# Patient Record
Sex: Female | Born: 1975 | Race: White | Hispanic: No | Marital: Married | Smoking: Never smoker
Health system: Southern US, Community
[De-identification: ages and names within clinical notes are randomized; demographics above are authoritative.]

## PROBLEM LIST (undated history)

## (undated) DIAGNOSIS — M79604 Pain in right leg: Secondary | ICD-10-CM

## (undated) DIAGNOSIS — Z8679 Personal history of other diseases of the circulatory system: Secondary | ICD-10-CM

## (undated) DIAGNOSIS — M542 Cervicalgia: Secondary | ICD-10-CM

## (undated) DIAGNOSIS — M549 Dorsalgia, unspecified: Secondary | ICD-10-CM

## (undated) DIAGNOSIS — I10 Essential (primary) hypertension: Secondary | ICD-10-CM

## (undated) DIAGNOSIS — Z9889 Other specified postprocedural states: Secondary | ICD-10-CM

## (undated) DIAGNOSIS — M961 Postlaminectomy syndrome, not elsewhere classified: Secondary | ICD-10-CM

## (undated) DIAGNOSIS — F4323 Adjustment disorder with mixed anxiety and depressed mood: Secondary | ICD-10-CM

## (undated) DIAGNOSIS — R200 Anesthesia of skin: Secondary | ICD-10-CM

## (undated) DIAGNOSIS — Z90710 Acquired absence of both cervix and uterus: Secondary | ICD-10-CM

## (undated) DIAGNOSIS — D649 Anemia, unspecified: Secondary | ICD-10-CM

## (undated) DIAGNOSIS — R112 Nausea with vomiting, unspecified: Secondary | ICD-10-CM

## (undated) HISTORY — DX: Cervicalgia: M54.2

## (undated) HISTORY — DX: Essential (primary) hypertension: I10

## (undated) HISTORY — DX: Anesthesia of skin: R20.0

## (undated) HISTORY — DX: Postlaminectomy syndrome, not elsewhere classified: M96.1

## (undated) HISTORY — DX: Dorsalgia, unspecified: M54.9

## (undated) HISTORY — DX: Adjustment disorder with mixed anxiety and depressed mood: F43.23

## (undated) HISTORY — DX: Pain in right leg: M79.604

## (undated) HISTORY — DX: Acquired absence of both cervix and uterus: Z90.710

---

## 1998-02-23 ENCOUNTER — Ambulatory Visit (HOSPITAL_COMMUNITY): Admission: RE | Admit: 1998-02-23 | Discharge: 1998-02-23 | Payer: Self-pay | Admitting: Obstetrics & Gynecology

## 1998-03-09 ENCOUNTER — Inpatient Hospital Stay (HOSPITAL_COMMUNITY): Admission: AD | Admit: 1998-03-09 | Discharge: 1998-03-09 | Payer: Self-pay | Admitting: Obstetrics & Gynecology

## 1998-04-14 HISTORY — PX: TUBAL LIGATION: SHX77

## 1998-04-21 ENCOUNTER — Emergency Department (HOSPITAL_COMMUNITY): Admission: EM | Admit: 1998-04-21 | Discharge: 1998-04-21 | Payer: Self-pay | Admitting: Emergency Medicine

## 1998-05-08 ENCOUNTER — Inpatient Hospital Stay (HOSPITAL_COMMUNITY): Admission: AD | Admit: 1998-05-08 | Discharge: 1998-05-11 | Payer: Self-pay | Admitting: Obstetrics & Gynecology

## 1998-06-14 ENCOUNTER — Other Ambulatory Visit: Admission: RE | Admit: 1998-06-14 | Discharge: 1998-06-14 | Payer: Self-pay | Admitting: Obstetrics & Gynecology

## 1999-05-20 ENCOUNTER — Other Ambulatory Visit: Admission: RE | Admit: 1999-05-20 | Discharge: 1999-05-20 | Payer: Self-pay | Admitting: Obstetrics & Gynecology

## 2000-11-01 DIAGNOSIS — Z90710 Acquired absence of both cervix and uterus: Secondary | ICD-10-CM

## 2000-11-01 HISTORY — DX: Acquired absence of both cervix and uterus: Z90.710

## 2001-08-19 ENCOUNTER — Other Ambulatory Visit: Admission: RE | Admit: 2001-08-19 | Discharge: 2001-08-19 | Payer: Self-pay | Admitting: Family Medicine

## 2001-10-23 ENCOUNTER — Emergency Department (HOSPITAL_COMMUNITY): Admission: EM | Admit: 2001-10-23 | Discharge: 2001-10-24 | Payer: Self-pay | Admitting: Emergency Medicine

## 2002-11-08 ENCOUNTER — Other Ambulatory Visit: Admission: RE | Admit: 2002-11-08 | Discharge: 2002-11-08 | Payer: Self-pay | Admitting: Family Medicine

## 2003-12-22 ENCOUNTER — Other Ambulatory Visit: Admission: RE | Admit: 2003-12-22 | Discharge: 2003-12-22 | Payer: Self-pay | Admitting: Family Medicine

## 2004-06-27 ENCOUNTER — Emergency Department: Payer: Self-pay | Admitting: General Practice

## 2005-01-31 ENCOUNTER — Ambulatory Visit: Payer: Self-pay | Admitting: Family Medicine

## 2005-02-14 ENCOUNTER — Encounter: Payer: Self-pay | Admitting: Family Medicine

## 2005-02-14 ENCOUNTER — Ambulatory Visit: Payer: Self-pay | Admitting: Family Medicine

## 2005-02-14 ENCOUNTER — Other Ambulatory Visit: Admission: RE | Admit: 2005-02-14 | Discharge: 2005-02-14 | Payer: Self-pay | Admitting: Family Medicine

## 2005-02-28 ENCOUNTER — Ambulatory Visit: Payer: Self-pay | Admitting: Family Medicine

## 2005-04-14 HISTORY — PX: TOTAL ABDOMINAL HYSTERECTOMY: SHX209

## 2005-04-14 HISTORY — PX: ABDOMINAL HYSTERECTOMY: SHX81

## 2006-05-29 ENCOUNTER — Ambulatory Visit: Payer: Self-pay | Admitting: Family Medicine

## 2006-05-29 LAB — CONVERTED CEMR LAB
ALT: 15 units/L (ref 0–40)
AST: 19 units/L (ref 0–37)
Albumin: 4.3 g/dL (ref 3.5–5.2)
Alkaline Phosphatase: 55 units/L (ref 39–117)
BUN: 11 mg/dL (ref 6–23)
Basophils Absolute: 0 10*3/uL (ref 0.0–0.1)
Basophils Relative: 0 % (ref 0.0–1.0)
Bilirubin, Direct: 0.2 mg/dL (ref 0.0–0.3)
CO2: 26 meq/L (ref 19–32)
Calcium: 9.2 mg/dL (ref 8.4–10.5)
Chloride: 106 meq/L (ref 96–112)
Cholesterol: 206 mg/dL (ref 0–200)
Creatinine, Ser: 0.9 mg/dL (ref 0.4–1.2)
Direct LDL: 124.8 mg/dL
Eosinophils Absolute: 0.1 10*3/uL (ref 0.0–0.6)
Eosinophils Relative: 1.2 % (ref 0.0–5.0)
GFR calc Af Amer: 93 mL/min
GFR calc non Af Amer: 77 mL/min
Glucose, Bld: 69 mg/dL — ABNORMAL LOW (ref 70–99)
HCT: 40.2 % (ref 36.0–46.0)
HDL: 65.7 mg/dL (ref >39.0)
Hemoglobin: 14.1 g/dL (ref 12.0–15.0)
Lymphocytes Relative: 24.8 % (ref 12.0–46.0)
MCHC: 35 g/dL (ref 30.0–36.0)
MCV: 87.4 fL (ref 78.0–100.0)
Monocytes Absolute: 0.5 10*3/uL (ref 0.2–0.7)
Monocytes Relative: 6.9 % (ref 3.0–11.0)
Neutro Abs: 5 10*3/uL (ref 1.4–7.7)
Neutrophils Relative %: 67.1 % (ref 43.0–77.0)
Platelets: 214 10*3/uL (ref 150–400)
Potassium: 3.8 meq/L (ref 3.5–5.1)
RBC: 4.6 M/uL (ref 3.87–5.11)
RDW: 11.9 % (ref 11.5–14.6)
Sodium: 140 meq/L (ref 135–145)
TSH: 1.66 microintl units/mL (ref 0.35–5.50)
Total Bilirubin: 1.1 mg/dL (ref 0.3–1.2)
Total CHOL/HDL Ratio: 3.1
Total Protein: 6.8 g/dL (ref 6.0–8.3)
Triglycerides: 59 mg/dL (ref 0–149)
VLDL: 12 mg/dL (ref 0–40)
WBC: 7.5 10*3/uL (ref 4.5–10.5)

## 2006-06-02 ENCOUNTER — Encounter: Payer: Self-pay | Admitting: Family Medicine

## 2006-06-02 ENCOUNTER — Ambulatory Visit: Payer: Self-pay | Admitting: Family Medicine

## 2006-06-02 ENCOUNTER — Other Ambulatory Visit: Admission: RE | Admit: 2006-06-02 | Discharge: 2006-06-02 | Payer: Self-pay | Admitting: Family Medicine

## 2006-06-19 ENCOUNTER — Encounter: Admission: RE | Admit: 2006-06-19 | Discharge: 2006-06-19 | Payer: Self-pay | Admitting: Family Medicine

## 2006-08-27 ENCOUNTER — Ambulatory Visit: Payer: Self-pay | Admitting: Family Medicine

## 2007-06-22 ENCOUNTER — Telehealth: Payer: Self-pay | Admitting: Family Medicine

## 2007-08-12 ENCOUNTER — Ambulatory Visit: Payer: Self-pay | Admitting: Family Medicine

## 2007-08-12 LAB — CONVERTED CEMR LAB
ALT: 14 units/L (ref 0–35)
AST: 18 units/L (ref 0–37)
Albumin: 4.1 g/dL (ref 3.5–5.2)
Alkaline Phosphatase: 51 units/L (ref 39–117)
BUN: 7 mg/dL (ref 6–23)
Basophils Absolute: 0 10*3/uL (ref 0.0–0.1)
Basophils Relative: 0.4 % (ref 0.0–1.0)
Bilirubin, Direct: 0.1 mg/dL (ref 0.0–0.3)
CO2: 30 meq/L (ref 19–32)
Calcium: 9.1 mg/dL (ref 8.4–10.5)
Chloride: 105 meq/L (ref 96–112)
Cholesterol: 209 mg/dL (ref 0–200)
Creatinine, Ser: 0.9 mg/dL (ref 0.4–1.2)
Direct LDL: 150.2 mg/dL
Eosinophils Absolute: 0.1 10*3/uL (ref 0.0–0.7)
Eosinophils Relative: 1.5 % (ref 0.0–5.0)
GFR calc Af Amer: 92 mL/min
GFR calc non Af Amer: 76 mL/min
Glucose, Bld: 78 mg/dL (ref 70–99)
Glucose, Urine, Semiquant: NEGATIVE
HCT: 39.6 % (ref 36.0–46.0)
HDL: 56.7 mg/dL (ref >39.0)
Hemoglobin: 13.6 g/dL (ref 12.0–15.0)
Lymphocytes Relative: 29.2 % (ref 12.0–46.0)
MCHC: 34.4 g/dL (ref 30.0–36.0)
MCV: 87.8 fL (ref 78.0–100.0)
Monocytes Absolute: 0.4 10*3/uL (ref 0.1–1.0)
Monocytes Relative: 7.7 % (ref 3.0–12.0)
Neutro Abs: 3.6 10*3/uL (ref 1.4–7.7)
Neutrophils Relative %: 61.2 % (ref 43.0–77.0)
Nitrite: NEGATIVE
Platelets: 258 10*3/uL (ref 150–400)
Potassium: 4 meq/L (ref 3.5–5.1)
RBC: 4.51 M/uL (ref 3.87–5.11)
RDW: 12.2 % (ref 11.5–14.6)
Sodium: 139 meq/L (ref 135–145)
Specific Gravity, Urine: >=1.03
TSH: 2.24 microintl units/mL (ref 0.35–5.50)
Total Bilirubin: 0.8 mg/dL (ref 0.3–1.2)
Total CHOL/HDL Ratio: 3.7
Total Protein: 6.7 g/dL (ref 6.0–8.3)
Triglycerides: 57 mg/dL (ref 0–149)
Urobilinogen, UA: 0.2
VLDL: 11 mg/dL (ref 0–40)
WBC: 5.8 10*3/uL (ref 4.5–10.5)
pH: 5.5

## 2007-08-18 DIAGNOSIS — F4323 Adjustment disorder with mixed anxiety and depressed mood: Secondary | ICD-10-CM

## 2007-08-18 HISTORY — DX: Adjustment disorder with mixed anxiety and depressed mood: F43.23

## 2007-08-19 ENCOUNTER — Other Ambulatory Visit: Admission: RE | Admit: 2007-08-19 | Discharge: 2007-08-19 | Payer: Self-pay | Admitting: Family Medicine

## 2007-08-19 ENCOUNTER — Ambulatory Visit: Payer: Self-pay | Admitting: Family Medicine

## 2007-08-19 ENCOUNTER — Encounter: Payer: Self-pay | Admitting: Family Medicine

## 2007-08-19 DIAGNOSIS — N6019 Diffuse cystic mastopathy of unspecified breast: Secondary | ICD-10-CM | POA: Insufficient documentation

## 2007-12-29 ENCOUNTER — Ambulatory Visit (HOSPITAL_COMMUNITY): Admission: RE | Admit: 2007-12-29 | Discharge: 2007-12-30 | Payer: Self-pay | Admitting: Obstetrics and Gynecology

## 2007-12-29 ENCOUNTER — Encounter (INDEPENDENT_AMBULATORY_CARE_PROVIDER_SITE_OTHER): Payer: Self-pay | Admitting: Obstetrics and Gynecology

## 2008-01-06 ENCOUNTER — Emergency Department (HOSPITAL_COMMUNITY): Admission: EM | Admit: 2008-01-06 | Discharge: 2008-01-07 | Payer: Self-pay | Admitting: Family Medicine

## 2010-05-16 NOTE — Progress Notes (Signed)
Summary: rx refill  Phone Note Outgoing Call Call back at [phone]   Caller: patient live Call For: Jakye Mullens Summary of Call: patient needs rx refill sertraline hcl 50mg  1 tab at bed time.  patient has cpx on 5-7.  [phone]  please call when called into phyamarcy pt is out ofmeds Initial call taken by: Celine Ahr,  June 22, 2007 4:28 PM Call placed by: jat Call placed to: Patient Summary of Call: Zoloft 50 mg, dispense 100 tablets, directions one nightly for refills [facility] Initial call taken by: Roderick Pee MD,  June 22, 2007 5:18 PM

## 2010-05-16 NOTE — Assessment & Plan Note (Signed)
Summary: cpx/pap/jls   Vital Signs:  Patient Profile:   35 Years Old Female Height:     61 inches Weight:      119 pounds Temp:     98.9 degrees F oral Pulse rate:   58 / minute BP sitting:   100 / 60  (left arm)  Vitals Entered By: Doristine Devoid (Aug 19, 2007 10:38 AM)                 Chief Complaint:  cpx and pap.  History of Present Illness: Brianna Ball is a 35 year old, married female, G2, P2, whose had a BTL, who comes in today for physical examination.  She states her periods are lasting 10 days to two weeks to very heavy and she sliding.  She's taken birth control pills prior to her BTL with no side effects.  She also takes 50 mg of Zoloft daily.  This is helped with her moods.  Lastly, we found two cysts in the right breast.  Mammogram and ultrasound were normal.  The cysts range up to one white except she can still feel one in her right breast around the tender clock position about 2 inches from her nipple.    Current Allergies: ! 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 No apparent alopecia or balding. Eyes:     No corneal or conjunctival inflammation noted. EOMI. Perrla. Funduscopic exam benign, without hemorrhages,  exudates or papilledema. Vision grossly normal. Ears:     External ear exam shows no significant lesions or deformities.  Otoscopic examination reveals clear canals, tympanic membranes are intact bilaterally without bulging, retraction, inflammation or discharge. Hearing is grossly normal bilaterally. Nose:     External nasal examination shows no deformity or inflammation. Nasal mucosa are pink and moist without lesions or exudates. Mouth:     Oral mucosa and oropharynx without lesions or exudates.  Teeth in good repair. Neck:     No deformities, masses, or tenderness noted. Chest Wall:     No deformities, masses, or tenderness noted. Breasts:     left wrist normal.  The right breast is normal except for a soft movable cystic-like lesion at the 10 o'clock position on the right breast about 2 inches from the nipple.  This is the site where she had a previous cyst.  It was much bigger and now it shrunk down in size,  but still present. Lungs:     Normal respiratory effort, chest expands symmetrically. Lungs are clear to auscultation, no crackles or wheezes. Heart:     Normal rate and regular rhythm. S1 and S2 normal without gallop, murmur, click, rub or other extra sounds. Msk:     No deformity or scoliosis noted of thoracic or lumbar spine.   Pulses:     R and L carotid,radial,femoral,dorsalis pedis and posterior tibial pulses are full and equal bilaterally Extremities:     No clubbing, cyanosis, edema, or deformity noted with normal full range of motion of all joints.   Neurologic:     No cranial nerve deficits noted. Station and gait are normal. Plantar reflexes are down-going bilaterally. DTRs are symmetrical throughout. Sensory, motor and coordinative functions appear intact. Skin:     Intact without suspicious lesions or rashes Cervical Nodes:     No lymphadenopathy noted Axillary Nodes:     No palpable lymphadenopathy Inguinal Nodes:     No significant adenopathy Psych:      Cognition and judgment appear intact. Alert and cooperative with normal attention span and concentration. No apparent delusions, illusions, hallucinations    Impression & Recommendations:  Problem # 1:  DYSFUNCTIONAL UTERINE BLEEDING (ICD-626.8) Assessment: Deteriorated  Problem # 2:  FIBROCYSTIC BREAST DISEASE (ICD-610.1) Assessment: Improved  Problem # 3:  EMOTIONAL INSTABILITY (ICD-296.99) Assessment: Improved  Complete Medication List: 1)  Multivitamins Tabs (Multiple vitamin) .... Once daily 2)  Zoloft 100 Mg Tabs (Sertraline hcl) .Marland Kitchen.. 1 tab @ bedtime 3)  Seasonale 0.15-0.03 Mg Tabs (Levonorgest-eth estrad 91-day) .... Uad   Patient Instructions: 1)  Take an Aspirin every day 2)  begin the Seasonale, tonight.  Return April 2003.  Next checkup sooner if any problems.  Remember to take an aspirin tablet daily with the BCPs also do a thorough breast exam once a month.  On your birthday.  Return if any problems   Prescriptions: SEASONALE 0.15-0.03 MG  TABS (LEVONORGEST-ETH ESTRAD 91-DAY) UAD  #1 x 3   Entered and Authorized by:   Roderick Pee MD   Signed by:   Roderick Pee MD on 08/19/2007   Method used:   Electronically sent to ...       [facility] [phone]       [address]       [place], [place]  [address]       Ph: [phone] or [phone]       Fax: [phone]   RxID:   [id number] ZOLOFT 100 MG  TABS (SERTRALINE HCL) 1 tab @ bedtime  #100 x 4   Entered and Authorized by:   Roderick Pee MD   Signed by:   Roderick Pee MD on 08/19/2007   Method used:   Electronically sent to ...       [facility] [place] [phone]       [address]       [place], [place]  [address]       Ph: [phone] or [phone]       Fax: [phone]   RxID:   [id number]  ]

## 2010-08-27 NOTE — Op Note (Signed)
Brianna Ball, Brianna Ball                ACCOUNT NO.:  [id number]   MEDICAL RECORD NO.:  [id number]          PATIENT TYPE:  OIB   LOCATION:  9303                          [facility]   PHYSICIAN:  Lenoard Aden, M.D.DATE OF BIRTH:  18-May-1975   DATE OF PROCEDURE:  12/29/2007  DATE OF DISCHARGE:                               OPERATIVE REPORT   PREOPERATIVE DIAGNOSES:  Dysmenorrhea and menorrhagia.   POSTOPERATIVE DIAGNOSES:  Dysmenorrhea and menorrhagia, enterocele.   PROCEDURES:  Total laparoscopic hysterectomy, McCall culdoplasty, and  lysis of adhesions.   SURGEON:  Lenoard Aden, MD   ASSISTANT:  Genia Del, M.D.   ANESTHESIA:  General.   ESTIMATED BLOOD LOSS:  Less than 100 mL.   COMPLICATIONS:  None.   DRAINS:  Foley.   COUNTS:  Correct.   The patient to recovery in good condition.   SPECIMEN:  Uterus and cervix to pathology.   BRIEF OPERATIVE NOTE:  After being apprised of risks of anesthesia,  infection, bleeding, injury to abdominal organs and need for repair,  delayed versus immediate complications to include bowel and bladder  injury, the patient was brought to the operating room where she was  administered general anesthetic without complications, prepped and  draped in usual sterile fashion.  Foley catheter was placed.  At this  time, after feet were placed in the Yellofin stirrups, a RUMI retractor  was placed per vagina in the standard fashion without difficulty.  At  this time, the infraumbilical incision was made with a scalpel.  Veress  needle was placed.  Opening pressure of -1 noted.  Three and a half  liter of CO2 insufflated without difficulty.  Trocar placed  atraumatically.  Pictures taken.  Normal liver, gallbladder bed, normal  appendiceal area, previously divided interrupted tubes and bilateral  normal ovaries, normal sized uterus, and normal posterior cul-de-sac and  anterior cul-de-sac with multiple adhesions of the bladder flap  to the  left round ligament and left peritoneal sidewall.  At this time, two 5-  mm trocars were made bilaterally under direct visualization and  transillumination in the right and left lower quadrant, and the bladder  adhesions to the round ligament into the left peritoneal sidewall were  lysed sharply using EndoShears.  At this time, the bladder flap was then  sharply developed carefully using sharp dissection along the lower  uterine segment.  After exposing the bladder reflection without  difficulty, the round ligaments were bilaterally grasped and ligated  using the Gyrus.  The tubo-ovarian ligaments were bilaterally grasped  and ligated using the Gyrus.  The uterine vessels were skeletonized  bilaterally, cauterized and divided.  The posterior reflection was  developed as well using sharp dissection.  The bladder flap was further  developed exposing the cervicovaginal junction and the RUMI cup was  palpated through the tissue.  At this time, a spatula was placed and the  circumcision was done circumferentially around the RUMI cup from along  the posterior back wall and then anteriorly.  Specimen is detached and  retracted into the vagina.  Good hemostasis was achieved  along the cuff  using the Gyrus, and at this time the Quill suture was used and entered  to suture in a continuous fashion from the right angle to the midline  and is cut and secured to midline and then from the left angle to the  midline without difficulty.  Enterocele was identified and plicated from  side to side the uterosacrals to uterosacral after previously  identifying the ureters bilaterally and also secured.  At this time,  good hemostasis was noted.  Irrigation was accomplished.  CO2 was  released and on revisualization, there was no evidence of any bleeding.  All instruments were removed under direct visualization after CO2 was  then released.  Good hemostasis was noted.  The incisions were closed  using 0  Vicryl  and Dermabond.  The patient tolerated the procedure  well.  Specimen was then removed from the vagina.  The patient was  awakened and transferred to recovery in good condition.      Lenoard Aden, M.D.  Electronically Signed     RJT/MEDQ  D:  12/29/2007  T:  12/30/2007  Job:  [id number]

## 2010-08-27 NOTE — Discharge Summary (Signed)
Brianna Ball, Brianna Ball                ACCOUNT NO.:  [id number]   MEDICAL RECORD NO.:  [id number]          PATIENT TYPE:  OIB   LOCATION:  9303                          [facility]   PHYSICIAN:  Lenoard Aden, M.D.DATE OF BIRTH:  1975-05-12   DATE OF ADMISSION:  12/29/2007  DATE OF DISCHARGE:                               DISCHARGE SUMMARY   CHIEF COMPLAINT:  Dysmenorrhea and menorrhagia.  She is a white female,  G3, P2, history C-section, tubal ligation with worsening dysmenorrhea,  menorrhagia, normal lab workup, abnormal ultrasound for definitive  therapy.   She has no known drug allergies.   Medications are Celexa.   FAMILY HISTORY:  Noncontributory.   SOCIAL HISTORY:  Noncontributory.   SURGICAL HISTORY:  Remarkable for C-section x2 and tubal ligation.   PHYSICAL EXAMINATION:  GENERAL:  Well-developed, well-nourished white  female in no acute distress.  HEENT:  Normal.  LUNGS:  Clear.  HEART:  Regular rate and rhythm.  ABDOMEN:  Soft, nontender.  PELVIC:  Reveals a normal sized uterus, mobile, no adnexal masses.  EXTREMITIES:  There are no cords.  NEUROLOGIC:  Nonfocal.  SKIN:  Intact.   IMPRESSION:  Severe refractory dysmenorrhea and menorrhagia, history of  C-section and tubal ligation for definitive therapy.   PLAN:  Proceed with proposed TLH, possible LAVH, and possible TAH.  Risks of anesthesia, infection, bleeding, injury to abdominal organs,  need for repair were discussed, delayed versus immediate complications  to include bowel and bladder injury noted.  The patient acknowledges and  wishes to proceed.      Lenoard Aden, M.D.  Electronically Signed     RJT/MEDQ  D:  12/29/2007  T:  12/29/2007  Job:  [id number]

## 2010-08-27 NOTE — H&P (Signed)
NAMEKENLEIGH, Brianna Ball                ACCOUNT NO.:  [id number]   MEDICAL RECORD NO.:  [id number]          PATIENT TYPE:  OIB   LOCATION:  9303                          [facility]   PHYSICIAN:  Lenoard Aden, M.D.DATE OF BIRTH:  Oct 02, 1975   DATE OF ADMISSION:  12/29/2007  DATE OF DISCHARGE:                              HISTORY & PHYSICAL   CHIEF COMPLAINT:  Dysmenorrhea, dyspareunia, and menorrhagia.   HISTORY:  A 35 year old white female, G3, P2, history of C-section x2  for definitive therapy.   ALLERGIES:  She has allergies to CODEINE DERIVATIVES.   She is a nonsmoker and nondrinker.  Denies domestic physical violence.   FAMILY HISTORY:  Diabetes and hypertension.   MEDICATIONS:  Celexa.   PERSONAL HISTORY:  Depression.  She has a history of 2 uncomplicated C-  sections and a tubal ligation.   PHYSICAL EXAMINATION:  VITAL SIGNS:  She is 5 feet and 1 inches.  Weight  of 125 pounds.  HEENT:  Normal.  LUNGS:  Clear.  HEART:  Regular rate and rhythm.  ABDOMEN:  Soft and nontender.  PELVIC:  Normal-sized uterus.  No adnexal masses.  EXTREMITIES:  There are no cords.  NEUROLOGIC:  Nonfocal.  SKIN:  Intact.   IMPRESSION:  Refractory dysmenorrhea, menorrhagia, and dysmenorrhea with  normal ultrasound and normal labs.  The patient desires definitive  therapy.   PLAN:  Proceed with TLH versus LAVH versus TAH.  Risks of anesthesia,  infection, bleeding, injury to abdominal organs, and need for repair  were discussed.  Delayed versus immediate complications to include bowel  and bladder injury noted, inability to cure pelvic pain discussed.  The  patient acknowledges and will proceed.      Lenoard Aden, M.D.  Electronically Signed     RJT/MEDQ  D:  12/28/2007  T:  12/29/2007  Job:  [id number]

## 2011-01-13 LAB — CBC
HCT: 31.3 — ABNORMAL LOW
HCT: 37.1
HCT: 40
Hemoglobin: 10.6 — ABNORMAL LOW
Hemoglobin: 12.7
Hemoglobin: 13.6
MCHC: 34
MCHC: 34.1
MCHC: 34.3
MCV: 87.3
MCV: 88.1
MCV: 89.4
Platelets: 231
Platelets: 260
Platelets: 274
RBC: 3.5 — ABNORMAL LOW
RBC: 4.25
RBC: 4.54
RDW: 12.1
RDW: 12.5
RDW: 12.7
WBC: 11.4 — ABNORMAL HIGH
WBC: 14.3 — ABNORMAL HIGH
WBC: 7.2

## 2011-01-13 LAB — DIFFERENTIAL
Basophils Absolute: 0.1
Basophils Relative: 1
Eosinophils Absolute: 0.1
Eosinophils Relative: 1
Lymphocytes Relative: 16
Lymphs Abs: 2.3
Monocytes Absolute: 1.1 — ABNORMAL HIGH
Monocytes Relative: 8
Neutro Abs: 10.6 — ABNORMAL HIGH
Neutrophils Relative %: 74

## 2011-01-13 LAB — URINALYSIS, ROUTINE W REFLEX MICROSCOPIC
Bilirubin Urine: NEGATIVE
Glucose, UA: NEGATIVE
Ketones, ur: 40 — AB
Leukocytes, UA: NEGATIVE
Nitrite: NEGATIVE
Protein, ur: NEGATIVE
Specific Gravity, Urine: 1.012
Urobilinogen, UA: 0.2
pH: 6

## 2011-01-13 LAB — COMPREHENSIVE METABOLIC PANEL
ALT: 24
AST: 19
Albumin: 3.8
Alkaline Phosphatase: 54
BUN: 8
CO2: 24
Calcium: 9.1
Chloride: 105
Creatinine, Ser: 0.72
GFR calc Af Amer: >60
GFR calc non Af Amer: >60
Glucose, Bld: 92
Potassium: 3.9
Sodium: 136
Total Bilirubin: 0.8
Total Protein: 6.5

## 2011-01-13 LAB — POCT URINALYSIS DIP (DEVICE)
Bilirubin Urine: NEGATIVE
Glucose, UA: NEGATIVE
Ketones, ur: 40 — AB
Nitrite: NEGATIVE
Operator id: 239701
Protein, ur: NEGATIVE
Specific Gravity, Urine: <=1.005
Urobilinogen, UA: 0.2
pH: 5.5

## 2011-01-13 LAB — HCG, SERUM, QUALITATIVE: Preg, Serum: NEGATIVE

## 2011-01-13 LAB — URINE MICROSCOPIC-ADD ON

## 2011-01-13 LAB — POCT PREGNANCY, URINE: Preg Test, Ur: NEGATIVE

## 2014-04-14 HISTORY — PX: CERVICAL DISC SURGERY: SHX588

## 2014-07-08 ENCOUNTER — Emergency Department: Payer: Self-pay | Admitting: Emergency Medicine

## 2014-07-08 LAB — CBC
HCT: 40 % (ref 35.0–47.0)
HGB: 13.3 g/dL (ref 12.0–16.0)
MCH: 28.8 pg (ref 26.0–34.0)
MCHC: 33.2 g/dL (ref 32.0–36.0)
MCV: 87 fL (ref 80–100)
Platelet: 247 10*3/uL (ref 150–440)
RBC: 4.61 10*6/uL (ref 3.80–5.20)
RDW: 12.9 % (ref 11.5–14.5)
WBC: 11 10*3/uL (ref 3.6–11.0)

## 2014-07-08 LAB — COMPREHENSIVE METABOLIC PANEL
Albumin: 4.3 g/dL
Alkaline Phosphatase: 55 U/L
Anion Gap: 5 — ABNORMAL LOW (ref 7–16)
BUN: 7 mg/dL
Bilirubin,Total: 0.7 mg/dL
Calcium, Total: 9 mg/dL
Chloride: 105 mmol/L
Co2: 27 mmol/L
Creatinine: 0.65 mg/dL
EGFR (African American): >60
EGFR (Non-African Amer.): >60
Glucose: 111 mg/dL — ABNORMAL HIGH
Potassium: 3.6 mmol/L
SGOT(AST): 18 U/L
SGPT (ALT): 14 U/L
Sodium: 137 mmol/L
Total Protein: 6.7 g/dL

## 2014-07-08 LAB — TROPONIN I: Troponin-I: <0.03 ng/mL

## 2014-07-11 ENCOUNTER — Other Ambulatory Visit: Payer: Self-pay | Admitting: Orthopedic Surgery

## 2014-07-11 DIAGNOSIS — M25511 Pain in right shoulder: Secondary | ICD-10-CM

## 2014-07-11 DIAGNOSIS — M542 Cervicalgia: Secondary | ICD-10-CM

## 2014-07-12 ENCOUNTER — Ambulatory Visit
Admission: RE | Admit: 2014-07-12 | Discharge: 2014-07-12 | Disposition: A | Discharge status: Home / Self Care | Payer: 59 | Source: Ambulatory Visit | Attending: Orthopedic Surgery | Admitting: Orthopedic Surgery

## 2014-07-12 DIAGNOSIS — M542 Cervicalgia: Secondary | ICD-10-CM

## 2014-07-12 DIAGNOSIS — M25511 Pain in right shoulder: Secondary | ICD-10-CM

## 2014-07-14 ENCOUNTER — Other Ambulatory Visit: Payer: Self-pay

## 2014-07-14 DIAGNOSIS — M542 Cervicalgia: Secondary | ICD-10-CM | POA: Insufficient documentation

## 2015-11-02 ENCOUNTER — Encounter: Payer: Self-pay | Admitting: Family Medicine

## 2015-11-02 ENCOUNTER — Ambulatory Visit (INDEPENDENT_AMBULATORY_CARE_PROVIDER_SITE_OTHER): Payer: 59 | Admitting: Family Medicine

## 2015-11-02 VITALS — BP 131/72 | HR 67 | Resp 16 | Ht 61.0 in | Wt 125.0 lb

## 2015-11-02 DIAGNOSIS — Z23 Encounter for immunization: Secondary | ICD-10-CM | POA: Diagnosis not present

## 2015-11-02 DIAGNOSIS — Z7189 Other specified counseling: Secondary | ICD-10-CM | POA: Insufficient documentation

## 2015-11-02 DIAGNOSIS — F4323 Adjustment disorder with mixed anxiety and depressed mood: Secondary | ICD-10-CM | POA: Diagnosis not present

## 2015-11-02 DIAGNOSIS — M5020 Other cervical disc displacement, unspecified cervical region: Secondary | ICD-10-CM

## 2015-11-02 DIAGNOSIS — M5010 Cervical disc disorder with radiculopathy, unspecified cervical region: Secondary | ICD-10-CM | POA: Insufficient documentation

## 2015-11-02 DIAGNOSIS — N6019 Diffuse cystic mastopathy of unspecified breast: Secondary | ICD-10-CM

## 2015-11-02 DIAGNOSIS — Z90710 Acquired absence of both cervix and uterus: Secondary | ICD-10-CM | POA: Diagnosis not present

## 2015-11-02 DIAGNOSIS — M542 Cervicalgia: Secondary | ICD-10-CM

## 2015-11-02 DIAGNOSIS — M5410 Radiculopathy, site unspecified: Secondary | ICD-10-CM | POA: Insufficient documentation

## 2015-11-02 HISTORY — DX: Cervicalgia: M54.2

## 2015-11-02 NOTE — Progress Notes (Signed)
Brianna Ball, D.O. [facility]   Subjective:    Chief Complaint  Patient presents with  . Establish Care   New pt, here to establish care.   HPI: Brianna DolphinJami R Ball is Ball pleasant 40 y.o. female who [facility] today To become established.  Last PCP- Brianna Ball.   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  Son- 17yo.  Daughter- 7020- Brianna Ball who I will see later on today.    Husband's family owns [employer] Ball Nurse, children'sheating [employer] air company.   Patient has concerns and questions about eating healthier and desires to get in shape.   She has no significant family history except for her mother having hypertension.  She has always had normal Pap smears and goes for yearly mammograms.      She declines any other further history. Of note I found in care everywhere:   Seen in March 2016 by Brianna Ball, of [facility]oped[facility]- for right shoulder and right neck pain. Later [facility]  She was seen in April 2016 by Ball doctor through [facility] [place]. Dr. Lurena NidaKamal Sami Ball, PAIN Medicine  for right upper extremity cervical radiculopathy, chronic neck pain.  At that time she was on Zanaflex, and oxycodone which was not strong enough and she was given Dilaudid for pain.      [place]erv[place] in [place] on 07/12/14 shows C5/C6 disc extrusion resulting in severe Right C6 foraminal stenosis.   Patient was also sent for surgical consultation to Brianna Ball by [facility]       Past Medical History  Diagnosis Date  . FIBROCYSTIC BREAST DISEASE 08/19/2007    Qualifier: Diagnosis of  By: Brianna Ball   . h/o Chronic cervical radiculopathy- 3/16 11/02/2015  . h/o Adjustment disorder with mixed anxiety and depressed mood 08/18/2007   Qualifier: Diagnosis of  By: Brianna Ball    . h/o Cervicalgia 11/02/2015  . History of hysterectomy for benign disease (DUB) 11/01/2000      Past Surgical History  Procedure Laterality Date  . Tubal ligation  2000  . Abdominal hysterectomy  2007  . Cervical disc surgery  2016      Family History  Problem Relation Age of Onset  . Hypertension Mother       History  Drug Use No  ,    History  Alcohol Use No  ,    History  Smoking status  . Never Smoker   Smokeless tobacco  . Never Used  ,     History  Sexual Activity  . Sexual Activity: Yes      Patient's Medications  New Prescriptions   No medications on file  Previous Medications   MULTIPLE VITAMINS-MINERALS (MULTIVITAMIN WITH MINERALS) TABLET    Take 1 tablet by mouth daily.  Modified Medications   No medications on file  Discontinued Medications   No medications on file     Codeine Outpatient Encounter Prescriptions as of 11/02/2015  Medication Sig  . Multiple Vitamins-Minerals (MULTIVITAMIN WITH MINERALS) tablet Take 1 tablet by mouth daily.   No facility-administered encounter medications on file as of 11/02/2015.     Immunization History  Administered Date(s) Administered  . Tdap 11/02/2015  Review of Systems:   ( Completed via Adult Medical History Intake form today ) General:   Denies fever, chills, appetite changes, unexplained weight loss.  Optho/Auditory:   Denies visual changes, blurred vision/LOV, ringing in ears/ diff hearing Respiratory:   Denies SOB, DOE, cough, wheezing.  Cardiovascular:   Denies chest pain, palpitations, new onset peripheral edema  Gastrointestinal:   Denies nausea, vomiting, diarrhea.  Genitourinary:    Denies dysuria, increased frequency, flank pain.  Endocrine:     Denies hot or cold intolerance, polyuria, polydipsia. Musculoskeletal:  Denies unexplained myalgias, joint swelling, arthralgias, gait problems.  Skin:  Denies rash, suspicious  lesions or new/ changes in moles Neurological:    Denies dizziness, syncope, unexplained weakness, lightheadedness, numbness  Psychiatric/Behavioral:   Denies mood changes, suicidal or homicidal ideations, hallucinations    Objective:   Blood pressure 131/72, pulse 67, resp. rate 16, height  (1.549 m), weight 125 lb (56.7 kg), SpO2 99 %. Body mass index is 23.63 kg/(m^2).  General: Well Developed, well nourished, and in no acute distress.  Neuro: Alert and oriented x3, extra-ocular muscles intact, sensation grossly intact.  HEENT: Normocephalic, atraumatic, pupils equal round reactive to light, neck supple, no gross masses, no carotid bruits, no JVD apprec Skin: no gross suspicious lesions or rashes  Cardiac: Regular rate and rhythm, no murmurs rubs or gallops.  Respiratory: Essentially clear to auscultation bilaterally. Not using accessory muscles, speaking in full sentences.  Abdominal: Soft, not grossly distended Musculoskeletal: Ambulates w/o diff, FROM * 4 ext.  Vasc: less 2 sec cap RF, warm and pink  Psych:  No HI/SI, judgement and insight good.    Impression and Recommendations:      1. Counseling on health promotion and disease prevention   2. h/o Adjustment disorder with mixed anxiety and depressed mood   3. History of hysterectomy for benign disease (DUB)   4. Diffuse cystic mastopathy, unspecified laterality   5. Need for Tdap vaccination   6. h/o Cervicalgia   7. h/o Cervical disc prolapse with radiculopathy- s/p decompression - anterior approach   Pt was in the office today for 40+ minutes, with over 50% time spent in face to face counseling of various medical questions and concerns; and in coordination of care.  The patient was counseled on including, but not limited to, normal blood pressure, BMI, CV health and emotional wellness issues;  risk factors for various diseases were discussed, anticipatory guidance given, need for routine screening examinations yearly  discussed.   Will get me records from [facility]- recent labs that where done- pt doesn't want to be redundant.    F/up one mo- come fasting in case we need additional labs.    Her Health Goal:  in am and 15 in pm- fast walking.  And take time for self- at least  Please see AVS handed out to patient at the end of our visit for further patient instructions/ counseling done pertaining to today's office visit.     Orders Placed This Encounter  Procedures  . Tdap vaccine greater than or equal to 7yo IM    Note: This document was prepared using Dragon voice recognition software and may include unintentional dictation errors.

## 2015-11-02 NOTE — Patient Instructions (Addendum)
[facility].    F/up one mo- come fasting in case.    Goal:  15min in am and 15 in pm- fast walking.      Top Ten Foods for Health  1. Water Drink at least 8 to 12 cups of water daily. Consume half of your body weight in pounds, is the amount of water in ounces to drink daily.  Ie: a 200lb person = 100 oz water daily  2. Dark Green Vegetables Eat dark green vegetables at least three to four times a week. Good options include broccoli, peppers, brussel sprouts and leafy greens like kale and spinach.  3. Whole Grains Whole grains should be included in your diet at least two to three times daily. Look for whole wheat flour, rye, oatmeal, barley, amaranth, quinoa or a multigrain. A good source of fiber includes 3 to 4 grams of fiber per serving. A great source has 5 or more grams of fiber per serving.  4. Beans and Lentils Try to eat a bean-based meal at least once a week. Try to add legumes, including beans and lentils, to soups, stews, casseroles, salads and dips or eat them plain.  5. Fish Try to eat two to three serving of fish a week. A serving consists of 3 to 4 ounces of cooked fish. Good choices are salmon, trout, herring, bluefish, sardines and tuna.  6. Berries Include two to four servings of fruit in your diet each day. Try to eat berries such as raspberries, blueberries, blackberries and strawberries.  7. Winter Squash Eat butternut and acorn squash as well as other richly pigmented dark orange and green colored vegetables like sweet potato, cantaloupe and mango.  8. Soy 25 grams of soy protein a day is recommended as part of a low-fat diet to help lower cholesterol levels. Try tofu, soymilk, edamame soybeans, tempeh and texturized vegetable protein (TVP).  9. Flaxseed, Nuts and Seeds Add 1 to 2 tablespoons of ground flaxseed or other seeds to food each day or include a moderate amount of nuts - 1/4 cup - in your daily diet.  10. Organic Yogurt Men  and women between 3319 and 42 years of age need 1000 milligrams of calcium a day and 1200 milligrams if 50 or older. Eat calcium-rich foods such as nonfat or low-fat dairy products three to four times a day. Include organic choices.

## 2015-11-21 ENCOUNTER — Ambulatory Visit: Payer: 59 | Admitting: Family Medicine

## 2015-11-23 ENCOUNTER — Ambulatory Visit (INDEPENDENT_AMBULATORY_CARE_PROVIDER_SITE_OTHER): Payer: 59

## 2015-11-23 ENCOUNTER — Encounter: Payer: Self-pay | Admitting: Family Medicine

## 2015-11-23 DIAGNOSIS — Z0000 Encounter for general adult medical examination without abnormal findings: Secondary | ICD-10-CM

## 2015-11-23 LAB — POCT GLYCOSYLATED HEMOGLOBIN (HGB A1C): Hemoglobin A1C: 5.1

## 2015-11-23 NOTE — Progress Notes (Signed)
Pt here for labs only.  T. Nelson, CMA 

## 2015-11-24 LAB — CBC WITH DIFFERENTIAL/PLATELET
Basophils Absolute: 57 cells/uL (ref 0–200)
Basophils Relative: 1 %
Eosinophils Absolute: 114 cells/uL (ref 15–500)
Eosinophils Relative: 2 %
HCT: 40.7 % (ref 35.0–45.0)
Hemoglobin: 13.5 g/dL (ref 11.7–15.5)
Lymphocytes Relative: 35 %
Lymphs Abs: 1995 cells/uL (ref 850–3900)
MCH: 28.7 pg (ref 27.0–33.0)
MCHC: 33.2 g/dL (ref 32.0–36.0)
MCV: 86.4 fL (ref 80.0–100.0)
MPV: 11.8 fL (ref 7.5–12.5)
Monocytes Absolute: 513 cells/uL (ref 200–950)
Monocytes Relative: 9 %
Neutro Abs: 3021 cells/uL (ref 1500–7800)
Neutrophils Relative %: 53 %
Platelets: 290 10*3/uL (ref 140–400)
RBC: 4.71 MIL/uL (ref 3.80–5.10)
RDW: 13.1 % (ref 11.0–15.0)
WBC: 5.7 10*3/uL (ref 3.8–10.8)

## 2015-11-24 LAB — LIPID PANEL
Cholesterol: 215 mg/dL — ABNORMAL HIGH (ref 125–200)
HDL: 76 mg/dL (ref >=46)
LDL Cholesterol: 121 mg/dL (ref <130)
Total CHOL/HDL Ratio: 2.8 Ratio (ref <=5.0)
Triglycerides: 91 mg/dL (ref <150)
VLDL: 18 mg/dL (ref <30)

## 2015-11-24 LAB — COMPREHENSIVE METABOLIC PANEL
ALT: 16 U/L (ref 6–29)
AST: 16 U/L (ref 10–30)
Albumin: 4.7 g/dL (ref 3.6–5.1)
Alkaline Phosphatase: 55 U/L (ref 33–115)
BUN: 10 mg/dL (ref 7–25)
CO2: 24 mmol/L (ref 20–31)
Calcium: 9.4 mg/dL (ref 8.6–10.2)
Chloride: 104 mmol/L (ref 98–110)
Creat: 0.88 mg/dL (ref 0.50–1.10)
Glucose, Bld: 83 mg/dL (ref 65–99)
Potassium: 4.3 mmol/L (ref 3.5–5.3)
Sodium: 139 mmol/L (ref 135–146)
Total Bilirubin: 0.6 mg/dL (ref 0.2–1.2)
Total Protein: 6.5 g/dL (ref 6.1–8.1)

## 2015-11-24 LAB — VITAMIN D 25 HYDROXY (VIT D DEFICIENCY, FRACTURES): Vit D, 25-Hydroxy: 29 ng/mL — ABNORMAL LOW (ref 30–100)

## 2015-11-24 LAB — TSH: TSH: 2.05 mIU/L

## 2015-11-26 NOTE — Progress Notes (Signed)
  I will discuss the results of these tests with the patient at our upcoming planned follow-up office visit.    (However Tonya, if patient is a no-show, please send them a letter with these results and ask pt to please contact our office for a follow-up office visit so we can discuss them and determine what further action, if any, is needed.  Thank you!)

## 2015-12-04 ENCOUNTER — Ambulatory Visit: Payer: 59 | Admitting: Family Medicine

## 2015-12-05 ENCOUNTER — Ambulatory Visit (INDEPENDENT_AMBULATORY_CARE_PROVIDER_SITE_OTHER): Payer: 59 | Admitting: Family Medicine

## 2015-12-05 ENCOUNTER — Encounter: Payer: Self-pay | Admitting: Family Medicine

## 2015-12-05 VITALS — BP 132/79 | HR 66 | Wt 123.3 lb

## 2015-12-05 DIAGNOSIS — R7989 Other specified abnormal findings of blood chemistry: Secondary | ICD-10-CM | POA: Insufficient documentation

## 2015-12-05 DIAGNOSIS — E559 Vitamin D deficiency, unspecified: Secondary | ICD-10-CM

## 2015-12-05 DIAGNOSIS — Z7189 Other specified counseling: Secondary | ICD-10-CM | POA: Diagnosis not present

## 2015-12-05 DIAGNOSIS — F4323 Adjustment disorder with mixed anxiety and depressed mood: Secondary | ICD-10-CM

## 2015-12-05 NOTE — Progress Notes (Signed)
Assessment and plan:  1. h/o Adjustment disorder with mixed anxiety and depressed mood   2. Vitamin D insufficiency   3. High serum high density lipoprotein (HDL)   4. Counseling on health promotion and disease prevention      Advised patient to use meditation and other techniques of relaxation on a daily basis.  Take 5,000 IU vit D3 daily.  We can rechk in 30moor w/in 1 yr  Cont to move more as we can see this has proven to be very beneficial to your health and labs as your HDL is the highest it's been in 6-8 years.   Melatonin 173mat most nightly-->    8 hour deep sleep: Delta waves, relaxing music sleep, sleep music, sleep meditation by yellow [employer] ( binaural beats or "delta waves" )   Guided meditation for sleep. Floating amongst the stars by JaGwynne EdingerHypnosis for clearing subconscious negativity by MiEdman CircleGuided meditation for detachment from overthinking by [place]atient to return to clinic if sleep is troublesome or she can't sleep/ relax as I told her we could discuss medications.   Patient's Medications  New Prescriptions   No medications on file  Previous Medications   MULTIPLE VITAMINS-MINERALS (MULTIVITAMIN WITH MINERALS) TABLET    Take 1 tablet by mouth daily.  Modified Medications   No medications on file  Discontinued Medications   No medications on file    Return in about 6 months (around 06/06/2016) for Follow-up of current medical issues.  Anticipatory guidance and routine counseling done re: condition, txmnt options and need for follow up. All questions of patient's were answered.   Gross side effects, risk and benefits, and alternatives of medications discussed with patient.  Patient is aware that all medications have potential side effects and we are unable to predict every sideeffect or drug-drug interaction that may occur.  Expresses  verbal understanding and consents to current therapy plan and treatment regiment.  Please see AVS handed out to patient at the end of our visit for additional patient instructions/ counseling done pertaining to today's office visit.  Note: This document was prepared using Dragon voice recognition software and may include unintentional dictation errors.   ----------------------------------------------------------------------------------------------------------------------  Subjective:   CC: Brianna Ball is a [age].o. 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    She is busy taking care of the house and admits to being a little OCD with cleanliness. She vacuums at least once daily.    She complains of difficulty with sleep. She always wakes up around 3 AM and then starts thinking and finds it difficult to fall back asleep.   Does not exercise but states she keeps herself very busy and going all the time.   Eats pretty healthy   Wt Readings from Last 3 Encounters:  12/05/15 123 lb 4.8 oz (55.9 kg)  11/02/15 125 lb (56.7 kg)  07/12/14 127 lb (57.6 kg)   BP Readings from Last 3 Encounters:  12/05/15 132/79  11/02/15 131/72  08/19/07 100/60   Pulse Readings from Last 3 Encounters:  12/05/15 66  11/02/15 67  08/19/07 (!) 58      Full medical history updated and reviewed in the office today  Patient  Active Problem List   Diagnosis Date Noted  . h/o Adjustment disorder with mixed anxiety and depressed mood 08/18/2007    Priority: High  . History of hysterectomy for benign disease (DUB) 11/01/2000    Priority: Medium  . Vitamin D insufficiency 12/05/2015  . High serum high density lipoprotein (HDL) 12/05/2015  . h/o Cervicalgia 11/02/2015  . Counseling on health promotion and disease prevention 11/02/2015  . h/o  Cervical disc w radiculopathy- s/p decompress; ant approach-Dr Joya Salm 11/02/2015  . b/l fibrocystic breast dx 08/19/2007    Past Medical History:  Diagnosis Date  . h/o Adjustment disorder with mixed anxiety and depressed mood 08/18/2007   Qualifier: Diagnosis of  By: Ronnald Ramp CMA, Chemira    . h/o Cervicalgia 11/02/2015  . History of hysterectomy for benign disease (DUB) 11/01/2000    Past Surgical History:  Procedure Laterality Date  . ABDOMINAL HYSTERECTOMY  2007  . [place] SURGERY  2016  . TUBAL LIGATION  2000    Social History  Substance Use Topics  . Smoking status: Never Smoker  . Smokeless tobacco: Never Used  . Alcohol use No    family history includes Hypertension in her mother.   Medications: Current Outpatient Prescriptions  Medication Sig Dispense Refill  . Multiple Vitamins-Minerals (MULTIVITAMIN WITH MINERALS) tablet Take 1 tablet by mouth daily.     No current facility-administered medications for this visit.     Allergies:  Allergies  Allergen Reactions  . Codeine     REACTION: itching     ROS:  Const:    no fevers, chills Eyes:    conjunctiva clear, no vision changes or blurred vision ENT:  no hearing difficulties, no dysphagia, no dysphonia, no nose bleeds CV:   no chest pain, arrhythmias, no orthopnea, no PND Pulm:   no SOB at rest or exertion, no Wheeze, no DIB, no hemoptysis GI:    no N/V/D/C, no abd pain GU:   no blood in urine or inc freq or urgency Heme/Onc:    no unexplained bleeding, no night sweats, no more fatigue than usual Neuro:   No dizziness, no LOC, No unexplained weakness or numbness Endo:   no unexplained wt loss or gain M-Sk:   no localized myalgias or arthralgias Psych:    No SI/HI, no memory prob or unexplained confusion    Objective:  Blood pressure 132/79, pulse 66, weight 123 lb 4.8 oz (55.9 kg).  Body mass index is 23.3 kg/m.  Gen: Well NAD, A and O *3 HEENT: [place]/AT, EOMI,  MMM, OP- clr Lungs: Normal work  of breathing. CTA B/L, no Wh, rhonchi Heart: RRR, S1, S2 WNL's, no MRG Abd: Soft. No gross distention Exts: warm, pink,  Brisk capillary refill, warm and well perfused.    Recent Results (from the past 2160 hour(s))  Lipid panel     Status: Abnormal   Collection Time: 11/23/15  9:38 AM  Result Value Ref Range   Cholesterol 215 (H) 125 - 200 mg/dL   Triglycerides 91 <150 mg/dL   HDL 76 >=46 mg/dL   Total CHOL/HDL Ratio 2.8 <=5.0 Ratio   VLDL 18 <30 mg/dL   LDL Cholesterol 121 <130 mg/dL    Comment:   Total Cholesterol/HDL Ratio:CHD Risk                        Coronary Heart Disease Risk Table  Men       Women          1/2 Average Risk              3.4        3.3              Average Risk              5.0        4.4           2X Average Risk              9.6        7.1           3X Average Risk             23.4       11.0 Use the calculated Patient Ratio above and the CHD Risk table  to determine the patient's CHD Risk.   CBC with Differential/Platelet     Status: None   Collection Time: 11/23/15  9:38 AM  Result Value Ref Range   WBC 5.7 3.8 - 10.8 K/uL   RBC 4.71 3.80 - 5.10 MIL/uL   Hemoglobin 13.5 11.7 - 15.5 g/dL   HCT 40.7 35.0 - 45.0 %   MCV 86.4 80.0 - 100.0 fL   MCH 28.7 27.0 - 33.0 pg   MCHC 33.2 32.0 - 36.0 g/dL   RDW 13.1 11.0 - 15.0 %   Platelets 290 140 - 400 K/uL   MPV 11.8 7.5 - 12.5 fL   Neutro Abs 3,021 1,500 - 7,800 cells/uL   Lymphs Abs 1,995 850 - 3,900 cells/uL   Monocytes Absolute 513 200 - 950 cells/uL   Eosinophils Absolute 114 15 - 500 cells/uL   Basophils Absolute 57 0 - 200 cells/uL   Neutrophils Relative % 53 %   Lymphocytes Relative 35 %   Monocytes Relative 9 %   Eosinophils Relative 2 %   Basophils Relative 1 %   Smear Review Criteria for review not met     Comment: ** Please note change in unit of measure and reference range(s). **  TSH     Status: None   Collection Time: 11/23/15  9:38 AM    Result Value Ref Range   TSH 2.05 mIU/L    Comment:   Reference Range   > or = 20 Years  0.40-4.50   Pregnancy Range First trimester  0.26-2.66 Second trimester 0.55-2.73 Third trimester  0.43-2.91     Comprehensive metabolic panel     Status: None   Collection Time: 11/23/15  9:38 AM  Result Value Ref Range   Sodium 139 135 - 146 mmol/L   Potassium 4.3 3.5 - 5.3 mmol/L   Chloride 104 98 - 110 mmol/L   CO2 24 20 - 31 mmol/L   Glucose, Bld 83 65 - 99 mg/dL   BUN 10 7 - 25 mg/dL   Creat 0.88 0.50 - 1.10 mg/dL   Total Bilirubin 0.6 0.2 - 1.2 mg/dL   Alkaline Phosphatase 55 33 - 115 U/L   AST 16 10 - 30 U/L   ALT 16 6 - 29 U/L   Total Protein 6.5 6.1 - 8.1 g/dL   Albumin 4.7 3.6 - 5.1 g/dL   Calcium 9.4 8.6 - 10.2 mg/dL  POCT glycosylated hemoglobin (Hb A1C)     Status: Normal   Collection Time: 11/23/15 10:19 AM  Result Value Ref Range   Hemoglobin  A1C 5.1   VITAMIN D 25 Hydroxy (Vit-D Deficiency, Fractures)     Status: Abnormal   Collection Time: 11/23/15 10:20 AM  Result Value Ref Range   Vit D, 25-Hydroxy 29 (L) 30 - 100 ng/mL    Comment: Vitamin D Status           25-OH Vitamin D        Deficiency                <20 ng/mL        Insufficiency         20 - 29 ng/mL        Optimal             > or = 30 ng/mL   For 25-OH Vitamin D testing on patients on D2-supplementation and patients for whom quantitation of D2 and D3 fractions is required, the Quest[facility] 25-OH VIT D, (D2,D3), LC/MS/MS is recommended: order code [phone] (patients > 2 yrs).

## 2015-12-05 NOTE — Patient Instructions (Addendum)
Take 5,000 IU vit D3 daily.  We can rechk in 33moor w/in 1 yr  Cont to exercise/ move more as we can see this has proven to be very beneficial to your health and labs.    Melatonin 151mat most nightly-->    8 hour deep sleep: Delta waves, relaxing music sleep, sleep music, sleep meditation by [employer] ( binaural beats or "delta waves" )   Guided meditation for sleep. Floating amongst the stars by JaAllie BossierHypnosis for clearing subconscious negativity by MiEdman Circleuided meditation for detachment from overthinking by Michael[employer] by MiEdman Circle    Stress and Stress Management Stress is a normal reaction to life events. It is what you feel when life demands more than you are used to or more than you can handle. Some stress can be useful. For example, the stress reaction can help you catch the last bus of the day, study for a test, or meet a deadline at work. But stress that occurs too often or for too long can cause problems. It can affect your emotional health and interfere with relationships and normal daily activities. Too much stress can weaken your immune system and increase your risk for physical illness. If you already have a medical problem, stress can make it worse. CAUSES  All sorts of life events may cause stress. An event that causes stress for one person may not be stressful for another person. Major life events commonly cause stress. These may be positive or negative. Examples include losing your job, moving into a new home, getting married, having a baby, or losing a loved one. Less obvious life events may also cause stress, especially if they occur day after day or in combination. Examples include working long hours, driving in traffic, caring for children, being in debt, or being in a difficult relationship. SIGNS AND SYMPTOMS Stress may cause emotional symptoms including, the following:  Anxiety. This is feeling worried, afraid, on edge, overwhelmed,  or out of control.  Anger. This is feeling irritated or impatient.  Depression. This is feeling sad, down, helpless, or guilty.  Difficulty focusing, remembering, or making decisions. Stress may cause physical symptoms, including the following:   Aches and pains. These may affect your head, neck, back, stomach, or other areas of your body.  Tight muscles or clenched jaw.  Low energy or trouble sleeping. Stress may cause unhealthy behaviors, including the following:   Eating to feel better (overeating) or skipping meals.  Sleeping too little, too much, or both.  Working too much or putting off tasks (procrastination).  Smoking, drinking alcohol, or using drugs to feel better. DIAGNOSIS  Stress is diagnosed through an assessment by your health care provider. Your health care provider will ask questions about your symptoms and any stressful life events.Your health care provider will also ask about your medical history and may order blood tests or other tests. Certain medical conditions and medicine can cause physical symptoms similar to stress. Mental illness can cause emotional symptoms and unhealthy behaviors similar to stress. Your health care provider may refer you to a mental health professional for further evaluation.  TREATMENT  Stress management is the recommended treatment for stress.The goals of stress management are reducing stressful life events and coping with stress in healthy ways.  Techniques for reducing stressful life events include the following:  Stress identification. Self-monitor for stress and identify what causes stress for you. These skills may help you to avoid some  stressful events.  Time management. Set your priorities, keep a calendar of events, and learn to say "no." These tools can help you avoid making too many commitments. Techniques for coping with stress include the following:  Rethinking the problem. Try to think realistically about stressful  events rather than ignoring them or overreacting. Try to find the positives in a stressful situation rather than focusing on the negatives.  Exercise. Physical exercise can release both physical and emotional tension. The key is to find a form of exercise you enjoy and do it regularly.  Relaxation techniques. These relax the body and mind. Examples include yoga, meditation, tai chi, biofeedback, deep breathing, progressive muscle relaxation, listening to music, being out in nature, journaling, and other hobbies. Again, the key is to find one or more that you enjoy and can do regularly.  Healthy lifestyle. Eat a balanced diet, get plenty of sleep, and do not smoke. Avoid using alcohol or drugs to relax.  Strong support network. Spend time with family, friends, or other people you enjoy being around.Express your feelings and talk things over with someone you trust. Counseling or talktherapy with a mental health professional may be helpful if you are having difficulty managing stress on your own. Medicine is typically not recommended for the treatment of stress.Talk to your health care provider if you think you need medicine for symptoms of stress. HOME CARE INSTRUCTIONS  Keep all follow-up visits as directed by your health care provider.  Take all medicines as directed by your health care provider. SEEK MEDICAL CARE IF:  Your symptoms get worse or you start having new symptoms.  You feel overwhelmed by your problems and can no longer manage them on your own. SEEK IMMEDIATE MEDICAL CARE IF:  You feel like hurting yourself or someone else.   This information is not intended to replace advice given to you by your health care provider. Make sure you discuss any questions you have with your health care provider.   Document Released: 09/24/2000 Document Revised: 04/21/2014 Document Reviewed: 11/23/2012 Elsevier Interactive Patient Education [employer].

## 2015-12-05 NOTE — Assessment & Plan Note (Signed)
Mood stable but just not sleeping so well. We discussed options. Pt declines meds at this time. See AVS for further txmnt

## 2015-12-07 NOTE — Progress Notes (Signed)
Pt has already been seen by Dr. Sharee Holsterpalski and labs were discussed.  Please close encounter as I am not able to.

## 2016-05-12 IMAGING — CT CT HEAD WITHOUT CONTRAST
1 series · 16 of 28 positions shown, 20 images · non-contrast
Comparison: None.

CLINICAL DATA: Right upper extremity tingling and numbness for 24
hr

EXAM:
CT HEAD WITHOUT CONTRAST
TECHNIQUE: Contiguous axial images were obtained from the base of the skull
through the vertex without intravenous contrast.

[Series 2: soft tissue · axial · 0.39mm/px · z∈[+358,+483]mm · 16 of 28 slices shown, 20 images]
[im 2/28  brain]
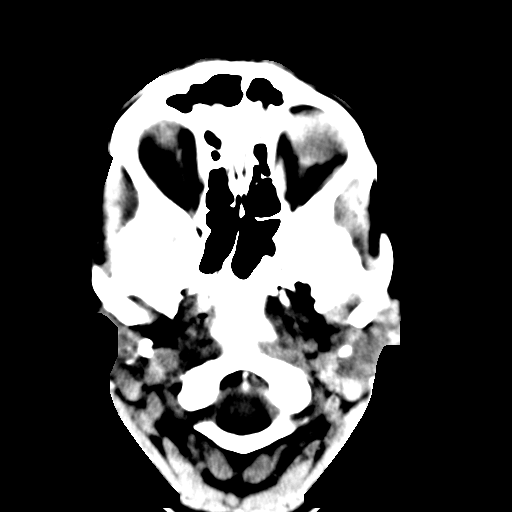
[im 2/28  bone]
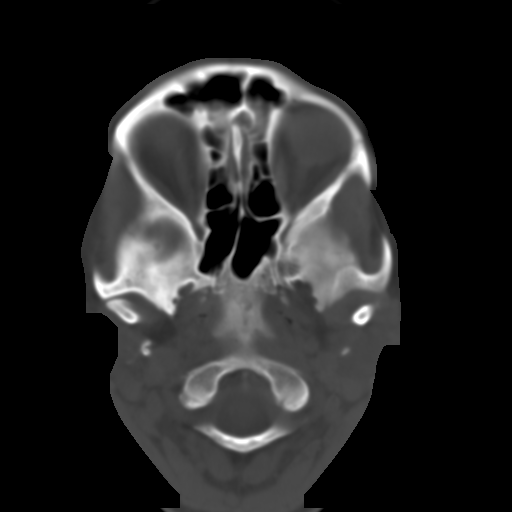
[im 4/28  brain]
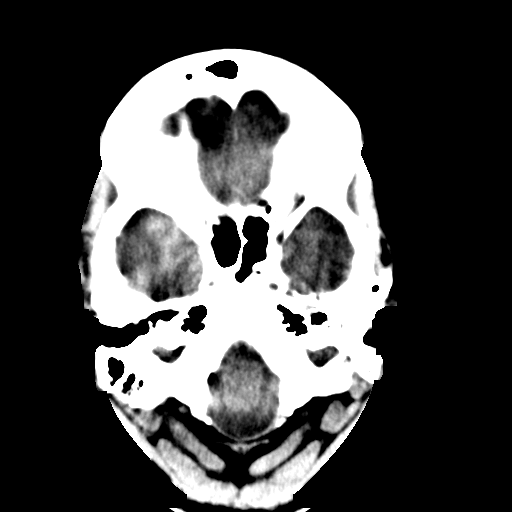
[im 6/28  brain]
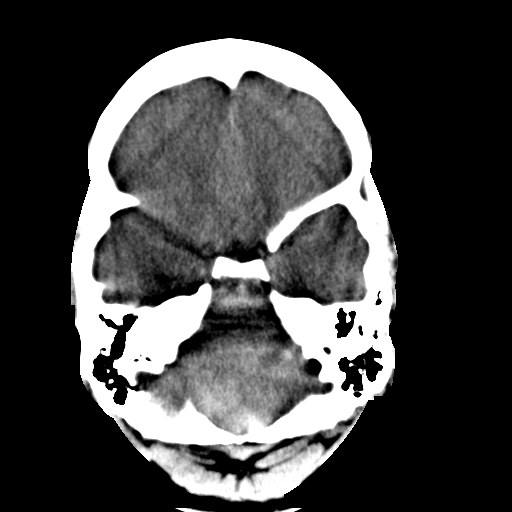
[im 7/28  brain]
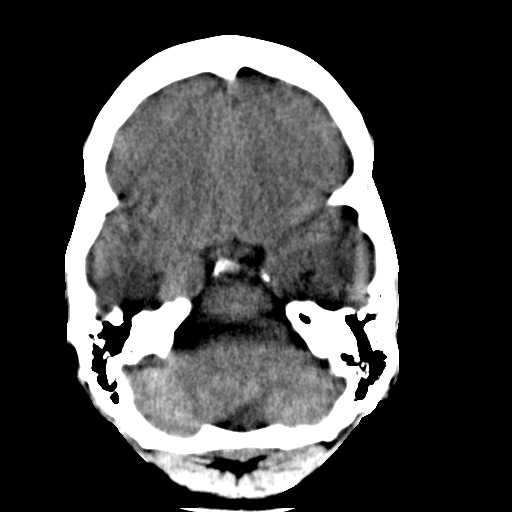
[im 9/28  brain]
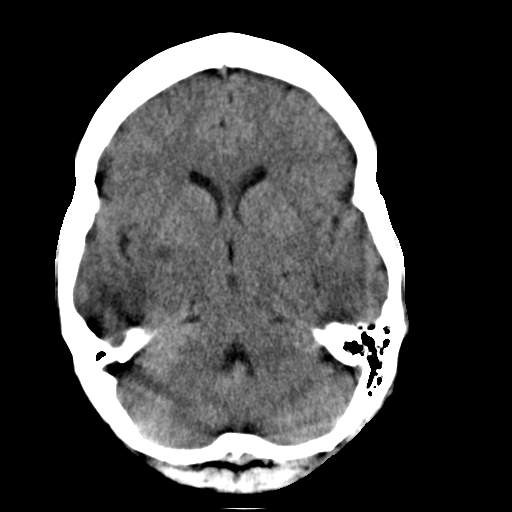
[im 9/28  bone]
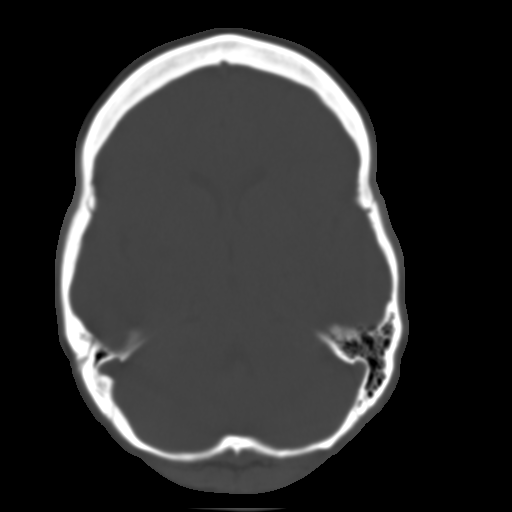
[im 10/28  brain]
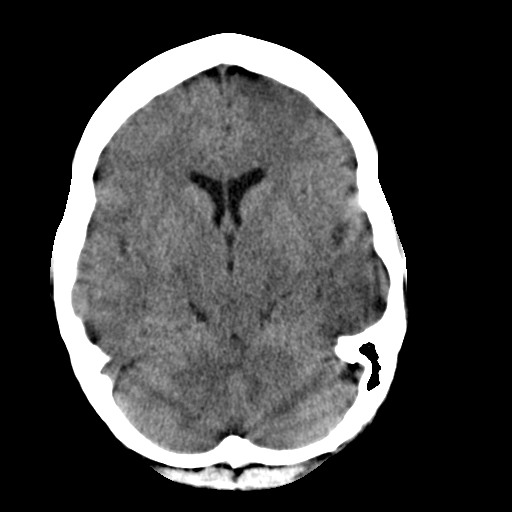
[im 12/28  brain]
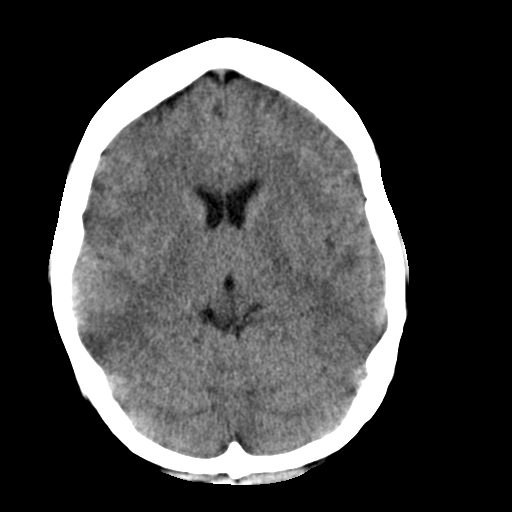
[im 14/28  brain]
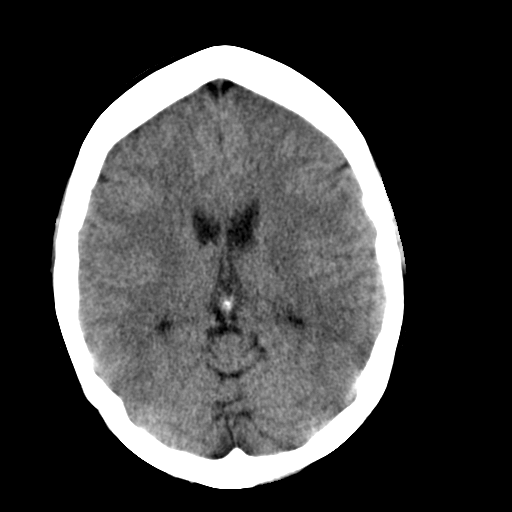
[im 15/28  brain]
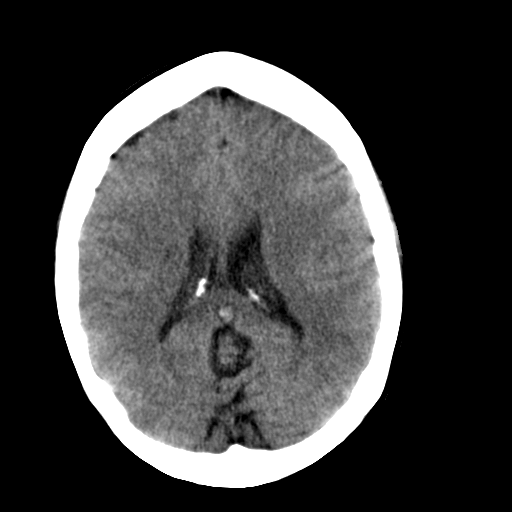
[im 15/28  bone]
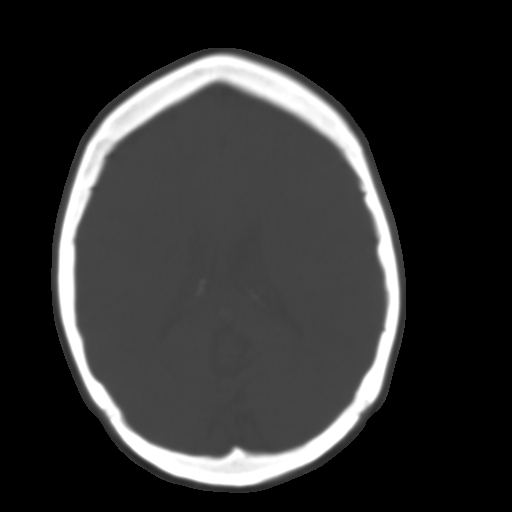
[im 17/28  brain]
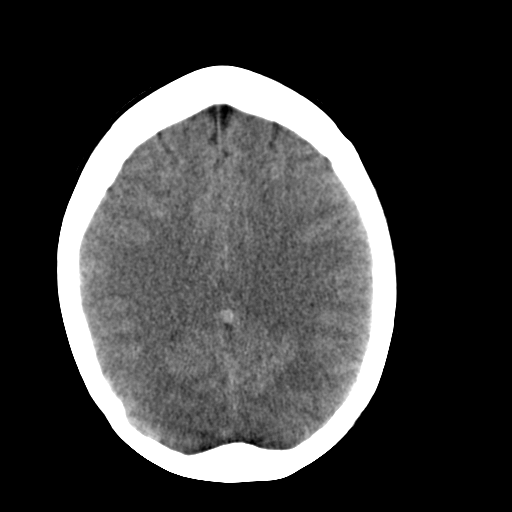
[im 19/28  brain]
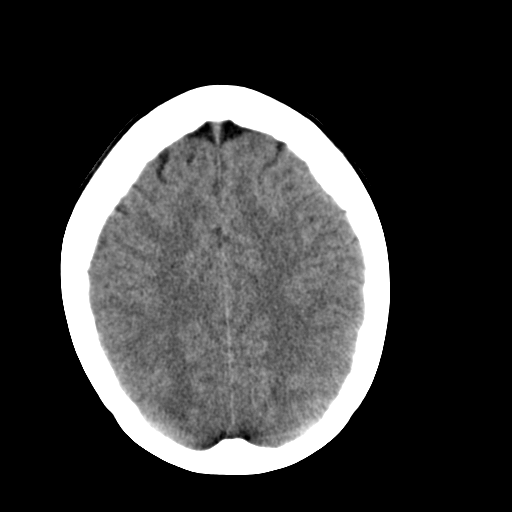
[im 20/28  brain]
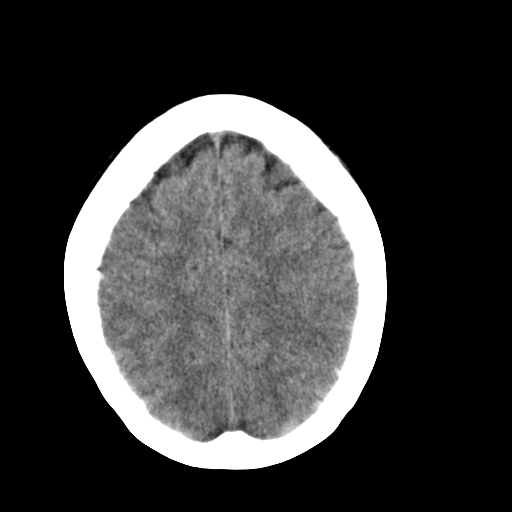
[im 22/28  brain]
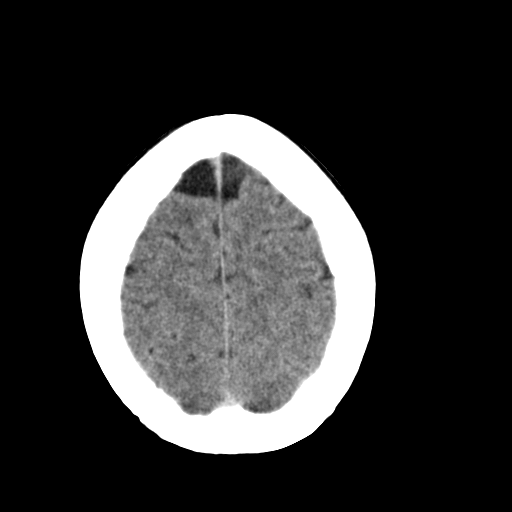
[im 22/28  bone]
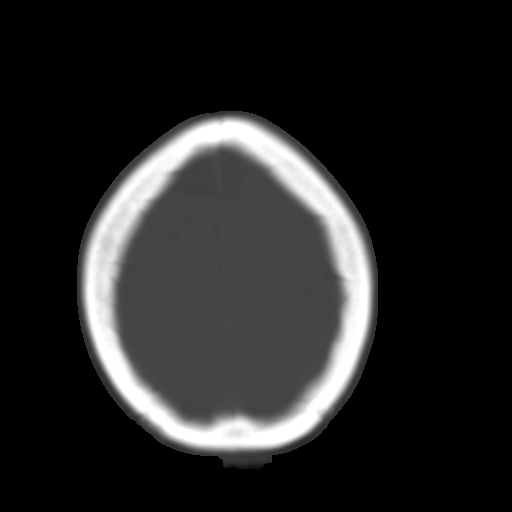
[im 23/28  brain]
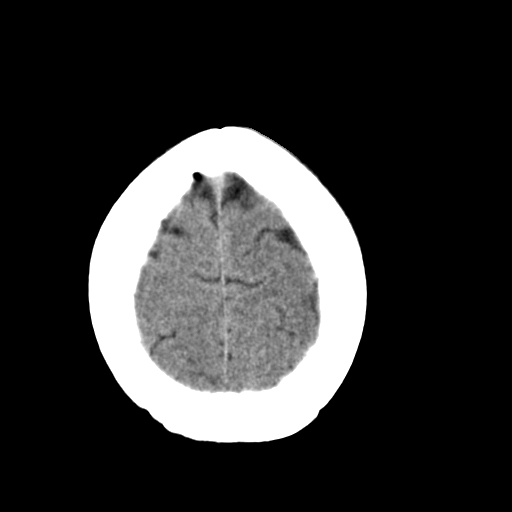
[im 25/28  brain]
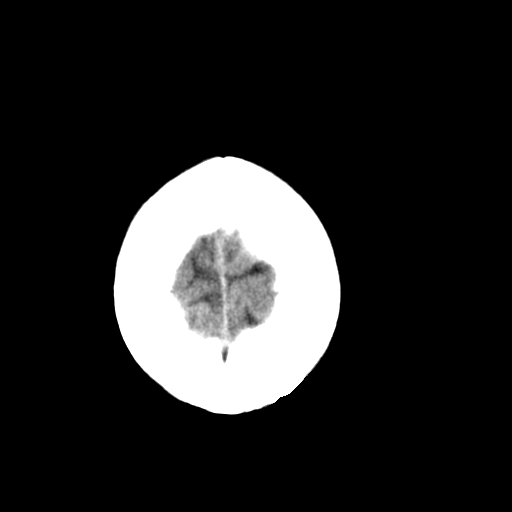
[im 27/28  brain]
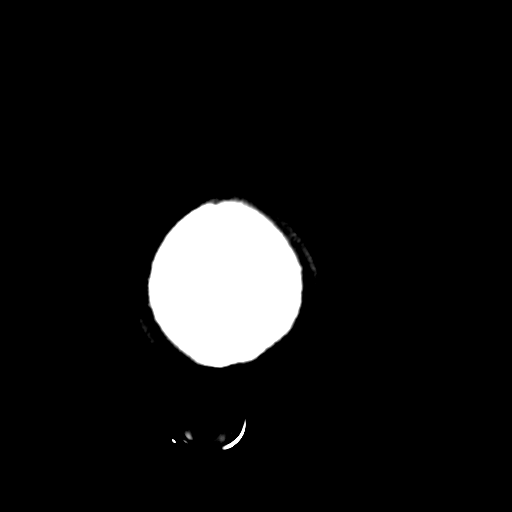

[16 of 28 positions shown; findings below may reference images not displayed]

FINDINGS: No acute hemorrhage, infarct, or mass lesion is identified. Images
are degraded by motion artifact. No skull fracture. Orbits and
paranasal sinuses are grossly unremarkable. CSF density remote right
basal ganglial lacunar infarct is identified image 8.
IMPRESSION: No acute intracranial finding.

## 2016-08-29 DIAGNOSIS — Z6823 Body mass index (BMI) 23.0-23.9, adult: Secondary | ICD-10-CM | POA: Diagnosis not present

## 2016-08-29 DIAGNOSIS — Z1231 Encounter for screening mammogram for malignant neoplasm of breast: Secondary | ICD-10-CM | POA: Diagnosis not present

## 2016-08-29 DIAGNOSIS — Z01419 Encounter for gynecological examination (general) (routine) without abnormal findings: Secondary | ICD-10-CM | POA: Diagnosis not present

## 2018-04-14 HISTORY — PX: MICRODISCECTOMY LUMBAR: SUR864

## 2018-07-12 ENCOUNTER — Encounter: Payer: 59 | Admitting: Family Medicine

## 2018-07-12 ENCOUNTER — Other Ambulatory Visit: Payer: 59

## 2018-11-02 ENCOUNTER — Telehealth: Payer: Self-pay | Admitting: Family Medicine

## 2018-11-02 NOTE — Telephone Encounter (Signed)
Mom called requested blood type of each child  Addison -DOB [birth year]  Cole---- DOB [birth year]00  Advised that unless DPR signed by children giving prermission ( they are both over 63yrs) not allowed.  --forwarding note to medical assistant.  --Dion Body

## 2018-11-03 NOTE — Telephone Encounter (Signed)
Called and left message to call the office back. MPulliam, CMA/RT(R)  

## 2018-12-22 ENCOUNTER — Other Ambulatory Visit: Payer: Self-pay

## 2018-12-22 DIAGNOSIS — R7989 Other specified abnormal findings of blood chemistry: Secondary | ICD-10-CM

## 2018-12-22 DIAGNOSIS — Z0000 Encounter for general adult medical examination without abnormal findings: Secondary | ICD-10-CM

## 2018-12-22 DIAGNOSIS — E559 Vitamin D deficiency, unspecified: Secondary | ICD-10-CM

## 2018-12-23 ENCOUNTER — Encounter: Payer: Self-pay | Admitting: Family Medicine

## 2018-12-23 ENCOUNTER — Other Ambulatory Visit: Payer: 59

## 2018-12-23 ENCOUNTER — Ambulatory Visit (INDEPENDENT_AMBULATORY_CARE_PROVIDER_SITE_OTHER): Payer: 59 | Admitting: Family Medicine

## 2018-12-23 ENCOUNTER — Other Ambulatory Visit: Payer: Self-pay

## 2018-12-23 VITALS — BP 131/66 | HR 63 | Temp 98.8°F | Resp 16 | Ht 62.0 in | Wt 138.6 lb

## 2018-12-23 DIAGNOSIS — Z0000 Encounter for general adult medical examination without abnormal findings: Secondary | ICD-10-CM | POA: Diagnosis not present

## 2018-12-23 DIAGNOSIS — Z90710 Acquired absence of both cervix and uterus: Secondary | ICD-10-CM | POA: Diagnosis not present

## 2018-12-23 DIAGNOSIS — Z7189 Other specified counseling: Secondary | ICD-10-CM

## 2018-12-23 DIAGNOSIS — N6019 Diffuse cystic mastopathy of unspecified breast: Secondary | ICD-10-CM | POA: Diagnosis not present

## 2018-12-23 DIAGNOSIS — R7989 Other specified abnormal findings of blood chemistry: Secondary | ICD-10-CM

## 2018-12-23 DIAGNOSIS — E559 Vitamin D deficiency, unspecified: Secondary | ICD-10-CM

## 2018-12-23 NOTE — Patient Instructions (Addendum)
If you would like to track your nutritional intake, look into the LoseIt app or the MyFitnessPal app.  Preventive Care for Adults, Female  A healthy lifestyle and preventive care can promote health and wellness. Preventive health guidelines for women include the following key practices.   A routine yearly physical is a good way to check with your health care provider about your health and preventive screening. It is a chance to share any concerns and updates on your health and to receive a thorough exam.   Visit your dentist for a routine exam and preventive care every 6 months. Brush your teeth twice a day and floss once a day. Good oral hygiene prevents tooth decay and gum disease.   The frequency of eye exams is based on your age, health, family medical history, use of contact lenses, and other factors. Follow your health care provider's recommendations for frequency of eye exams.   Eat a healthy diet. Foods like vegetables, fruits, whole grains, low-fat dairy products, and lean protein foods contain the nutrients you need without too many calories. Decrease your intake of foods high in solid fats, added sugars, and salt. Eat the right amount of calories for you.Get information about a proper diet from your health care provider, if necessary.   Regular physical exercise is one of the most important things you can do for your health. Most adults should get at least 150 minutes of moderate-intensity exercise (any activity that increases your heart rate and causes you to sweat) each week. In addition, most adults need muscle-strengthening exercises on 2 or more days a week.   Maintain a healthy weight. The body mass index (BMI) is a screening tool to identify possible weight problems. It provides an estimate of body fat based on height and weight. Your health care provider can find your BMI, and can help you achieve or maintain a healthy weight.For adults 20 years and older:   - A BMI below  18.5 is considered underweight.   - A BMI of 18.5 to 24.9 is normal.   - A BMI of 25 to 29.9 is considered overweight.   - A BMI of 30 and above is considered obese.   Maintain normal blood lipids and cholesterol levels by exercising and minimizing your intake of trans and saturated fats.  Eat a balanced diet with plenty of fruit and vegetables. Blood tests for lipids and cholesterol should begin at age 63 and be repeated every 5 years minimum.  If your lipid or cholesterol levels are high, you are over 40, or you are at high risk for heart disease, you may need your cholesterol levels checked more frequently.Ongoing high lipid and cholesterol levels should be treated with medicines if diet and exercise are not working.   If you smoke, find out from your health care provider how to quit. If you do not use tobacco, do not start.   Lung cancer screening is recommended for adults aged 44-80 years who are at high risk for developing lung cancer because of a history of smoking. A yearly low-dose CT scan of the lungs is recommended for people who have at least a 30-pack-year history of smoking and are a current smoker or have quit within the past 15 years. A pack year of smoking is smoking an average of 1 pack of cigarettes a day for 1 year (for example: 1 pack a day for 30 years or 2 packs a day for 15 years). Yearly screening should continue until the  smoker has stopped smoking for at least 15 years. Yearly screening should be stopped for people who develop a health problem that would prevent them from having lung cancer treatment.   If you are pregnant, do not drink alcohol. If you are breastfeeding, be very cautious about drinking alcohol. If you are not pregnant and choose to drink alcohol, do not have more than 1 drink per day. One drink is considered to be 12 ounces (355 mL) of beer, 5 ounces (148 mL) of wine, or 1.5 ounces (44 mL) of liquor.   Avoid use of street drugs. Do not share needles with  anyone. Ask for help if you need support or instructions about stopping the use of drugs.   High blood pressure causes heart disease and increases the risk of stroke. Your blood pressure should be checked at least yearly.  Ongoing high blood pressure should be treated with medicines if weight loss and exercise do not work.   If you are 74-66 years old, ask your health care provider if you should take aspirin to prevent strokes.   Diabetes screening involves taking a blood sample to check your fasting blood sugar level. This should be done once every 3 years, after age 18, if you are within normal weight and without risk factors for diabetes. Testing should be considered at a younger age or be carried out more frequently if you are overweight and have at least 1 risk factor for diabetes.   Breast cancer screening is essential preventive care for women. You should practice "breast self-awareness."  This means understanding the normal appearance and feel of your breasts and may include breast self-examination.  Any changes detected, no matter how small, should be reported to a health care provider.  Women in their 82s and 30s should have a clinical breast exam (CBE) by a health care provider as part of a regular health exam every 1 to 3 years.  After age 73, women should have a CBE every year.  Starting at age 46, women should consider having a mammogram (breast X-ray test) every year.  Women who have a family history of breast cancer should talk to their health care provider about genetic screening.  Women at a high risk of breast cancer should talk to their health care providers about having an MRI and a mammogram every year.   -Breast cancer gene (BRCA)-related cancer risk assessment is recommended for women who have family members with BRCA-related cancers. BRCA-related cancers include breast, ovarian, tubal, and peritoneal cancers. Having family members with these cancers may be associated with an  increased risk for harmful changes (mutations) in the breast cancer genes BRCA1 and BRCA2. Results of the assessment will determine the need for genetic counseling and BRCA1 and BRCA2 testing.   The Pap test is a screening test for cervical cancer. A Pap test can show cell changes on the cervix that might become cervical cancer if left untreated. A Pap test is a procedure in which cells are obtained and examined from the lower end of the uterus (cervix).   - Women should have a Pap test starting at age 27.   - Between ages 5 and 51, Pap tests should be repeated every 2 years.   - Beginning at age 45, you should have a Pap test every 3 years as long as the past 3 Pap tests have been normal.   - Some women have medical problems that increase the chance of getting cervical cancer. Talk to your  health care provider about these problems. It is especially important to talk to your health care provider if a new problem develops soon after your last Pap test. In these cases, your health care provider may recommend more frequent screening and Pap tests.   - The above recommendations are the same for women who have or have not gotten the vaccine for human papillomavirus (HPV).   - If you had a hysterectomy for a problem that was not cancer or a condition that could lead to cancer, then you no longer need Pap tests. Even if you no longer need a Pap test, a regular exam is a good idea to make sure no other problems are starting.   - If you are between ages 69 and 42 years, and you have had normal Pap tests going back 10 years, you no longer need Pap tests. Even if you no longer need a Pap test, a regular exam is a good idea to make sure no other problems are starting.   - If you have had past treatment for cervical cancer or a condition that could lead to cancer, you need Pap tests and screening for cancer for at least 20 years after your treatment.   - If Pap tests have been discontinued, risk factors  (such as a new sexual partner) need to be reassessed to determine if screening should be resumed.   - The HPV test is an additional test that may be used for cervical cancer screening. The HPV test looks for the virus that can cause the cell changes on the cervix. The cells collected during the Pap test can be tested for HPV. The HPV test could be used to screen women aged 91 years and older, and should be used in women of any age who have unclear Pap test results. After the age of 76, women should have HPV testing at the same frequency as a Pap test.   Colorectal cancer can be detected and often prevented. Most routine colorectal cancer screening begins at the age of 62 years and continues through age 50 years. However, your health care provider may recommend screening at an earlier age if you have risk factors for colon cancer. On a yearly basis, your health care provider may provide home test kits to check for hidden blood in the stool.  Use of a small camera at the end of a tube, to directly examine the colon (sigmoidoscopy or colonoscopy), can detect the earliest forms of colorectal cancer. Talk to your health care provider about this at age 64, when routine screening begins. Direct exam of the colon should be repeated every 5 -10 years through age 49 years, unless early forms of pre-cancerous polyps or small growths are found.   People who are at an increased risk for hepatitis B should be screened for this virus. You are considered at high risk for hepatitis B if:  -You were born in a country where hepatitis B occurs often. Talk with your health care provider about which countries are considered high risk.  - Your parents were born in a high-risk country and you have not received a shot to protect against hepatitis B (hepatitis B vaccine).  - You have HIV or AIDS.  - You use needles to inject street drugs.  - You live with, or have sex with, someone who has Hepatitis B.  - You get hemodialysis  treatment.  - You take certain medicines for conditions like cancer, organ transplantation, and autoimmune conditions.  Hepatitis C blood testing is recommended for all people born from [birth year] through 1965 and any individual with known risks for hepatitis C.   Practice safe sex. Use condoms and avoid high-risk sexual practices to reduce the spread of sexually transmitted infections (STIs). STIs include gonorrhea, chlamydia, syphilis, trichomonas, herpes, HPV, and human immunodeficiency virus (HIV). Herpes, HIV, and HPV are viral illnesses that have no cure. They can result in disability, cancer, and death. Sexually active women aged 75 years and younger should be checked for chlamydia. Older women with new or multiple partners should also be tested for chlamydia. Testing for other STIs is recommended if you are sexually active and at increased risk.   Osteoporosis is a disease in which the bones lose minerals and strength with aging. This can result in serious bone fractures or breaks. The risk of osteoporosis can be identified using a bone density scan. Women ages 2 years and over and women at risk for fractures or osteoporosis should discuss screening with their health care providers. Ask your health care provider whether you should take a calcium supplement or vitamin D to There are also several preventive steps women can take to avoid osteoporosis and resulting fractures or to keep osteoporosis from worsening. -->Recommendations include:  Eat a balanced diet high in fruits, vegetables, calcium, and vitamins.  Get enough calcium. The recommended total intake of is 1,200 mg daily; for best absorption, if taking supplements, divide doses into 250-500 mg doses throughout the day. Of the two types of calcium, calcium carbonate is best absorbed when taken with food but calcium citrate can be taken on an empty stomach.  Get enough vitamin D. NAMS and the [place] recommend at  least 1,000 IU per day for women age 34 and over who are at risk of vitamin D deficiency. Vitamin D deficiency can be caused by inadequate sun exposure (for example, those who live in [place]).  Avoid alcohol and smoking. Heavy alcohol intake (more than 7 drinks per week) increases the risk of falls and hip fracture and women smokers tend to lose bone more rapidly and have lower bone mass than nonsmokers. Stopping smoking is one of the most important changes women can make to improve their health and decrease risk for disease.  Be physically active every day. Weight-bearing exercise (for example, fast walking, hiking, jogging, and weight training) may strengthen bones or slow the rate of bone loss that comes with aging. Balancing and muscle-strengthening exercises can reduce the risk of falling and fracture.  Consider therapeutic medications. Currently, several types of effective drugs are available. Healthcare providers can recommend the type most appropriate for each woman.  Eliminate environmental factors that may contribute to accidents. Falls cause nearly 90% of all osteoporotic fractures, so reducing this risk is an important bone-health strategy. Measures include ample lighting, removing obstructions to walking, using nonskid rugs on floors, and placing mats and/or grab bars in showers.  Be aware of medication side effects. Some common medicines make bones weaker. These include a type of steroid drug called glucocorticoids used for arthritis and asthma, some antiseizure drugs, certain sleeping pills, treatments for endometriosis, and some cancer drugs. An overactive thyroid gland or using too much thyroid hormone for an underactive thyroid can also be a problem. If you are taking these medicines, talk to your doctor about what you can do to help protect your bones.reduce the rate of osteoporosis.    Menopause can be associated with physical symptoms and risks. Hormone replacement  therapy is available to decrease symptoms and risks. You should talk to your health care provider about whether hormone replacement therapy is right for you.   Use sunscreen. Apply sunscreen liberally and repeatedly throughout the day. You should seek shade when your shadow is shorter than you. Protect yourself by wearing long sleeves, pants, a wide-brimmed hat, and sunglasses year round, whenever you are outdoors.   Once a month, do a whole body skin exam, using a mirror to look at the skin on your back. Tell your health care provider of new moles, moles that have irregular borders, moles that are larger than a pencil eraser, or moles that have changed in shape or color.   -Stay current with required vaccines (immunizations).   Influenza vaccine. All adults should be immunized every year.  Tetanus, diphtheria, and acellular pertussis (Td, Tdap) vaccine. Pregnant women should receive 1 dose of Tdap vaccine during each pregnancy. The dose should be obtained regardless of the length of time since the last dose. Immunization is preferred during the 27th 36th week of gestation. An adult who has not previously received Tdap or who does not know her vaccine status should receive 1 dose of Tdap. This initial dose should be followed by tetanus and diphtheria toxoids (Td) booster doses every 10 years. Adults with an unknown or incomplete history of completing a 3-dose immunization series with Td-containing vaccines should begin or complete a primary immunization series including a Tdap dose. Adults should receive a Td booster every 10 years.  Varicella vaccine. An adult without evidence of immunity to varicella should receive 2 doses or a second dose if she has previously received 1 dose. Pregnant females who do not have evidence of immunity should receive the first dose after pregnancy. This first dose should be obtained before leaving the health care facility. The second dose should be obtained 4 8 weeks  after the first dose.  Human papillomavirus (HPV) vaccine. Females aged 59 26 years who have not received the vaccine previously should obtain the 3-dose series. The vaccine is not recommended for use in pregnant females. However, pregnancy testing is not needed before receiving a dose. If a female is found to be pregnant after receiving a dose, no treatment is needed. In that case, the remaining doses should be delayed until after the pregnancy. Immunization is recommended for any person with an immunocompromised condition through the age of 28 years if she did not get any or all doses earlier. During the 3-dose series, the second dose should be obtained 4 8 weeks after the first dose. The third dose should be obtained 24 weeks after the first dose and 16 weeks after the second dose.  Zoster vaccine. One dose is recommended for adults aged 18 years or older unless certain conditions are present.  Measles, mumps, and rubella (MMR) vaccine. Adults born before [birth year] generally are considered immune to measles and mumps. Adults born in [birth year] or later should have 1 or more doses of MMR vaccine unless there is a contraindication to the vaccine or there is laboratory evidence of immunity to each of the three diseases. A routine second dose of MMR vaccine should be obtained at least 28 days after the first dose for students attending postsecondary schools, health care workers, or international travelers. People who received inactivated measles vaccine or an unknown type of measles vaccine during 1963 1967 should receive 2 doses of MMR vaccine. People who received inactivated mumps vaccine or an unknown type of mumps vaccine before  1979 and are at high risk for mumps infection should consider immunization with 2 doses of MMR vaccine. For females of childbearing age, rubella immunity should be determined. If there is no evidence of immunity, females who are not pregnant should be vaccinated. If there is no evidence of  immunity, females who are pregnant should delay immunization until after pregnancy. Unvaccinated health care workers born before [birth year] who lack laboratory evidence of measles, mumps, or rubella immunity or laboratory confirmation of disease should consider measles and mumps immunization with 2 doses of MMR vaccine or rubella immunization with 1 dose of MMR vaccine.  Pneumococcal 13-valent conjugate (PCV13) vaccine. When indicated, a person who is uncertain of her immunization history and has no record of immunization should receive the PCV13 vaccine. An adult aged 36 years or older who has certain medical conditions and has not been previously immunized should receive 1 dose of PCV13 vaccine. This PCV13 should be followed with a dose of pneumococcal polysaccharide (PPSV23) vaccine. The PPSV23 vaccine dose should be obtained at least 8 weeks after the dose of PCV13 vaccine. An adult aged 63 years or older who has certain medical conditions and previously received 1 or more doses of PPSV23 vaccine should receive 1 dose of PCV13. The PCV13 vaccine dose should be obtained 1 or more years after the last PPSV23 vaccine dose.  Pneumococcal polysaccharide (PPSV23) vaccine. When PCV13 is also indicated, PCV13 should be obtained first. All adults aged 52 years and older should be immunized. An adult younger than age 19 years who has certain medical conditions should be immunized. Any person who resides in a nursing home or long-term care facility should be immunized. An adult smoker should be immunized. People with an immunocompromised condition and certain other conditions should receive both PCV13 and PPSV23 vaccines. People with human immunodeficiency virus (HIV) infection should be immunized as soon as possible after diagnosis. Immunization during chemotherapy or radiation therapy should be avoided. Routine use of PPSV23 vaccine is not recommended for American Indians, [place] Natives, or people younger than 65 years  unless there are medical conditions that require PPSV23 vaccine. When indicated, people who have unknown immunization and have no record of immunization should receive PPSV23 vaccine. One-time revaccination 5 years after the first dose of PPSV23 is recommended for people aged 53 64 years who have chronic kidney failure, nephrotic syndrome, asplenia, or immunocompromised conditions. People who received 1 2 doses of PPSV23 before age 69 years should receive another dose of PPSV23 vaccine at age 11 years or later if at least 5 years have passed since the previous dose. Doses of PPSV23 are not needed for people immunized with PPSV23 at or after age 74 years.  Meningococcal vaccine. Adults with asplenia or persistent complement component deficiencies should receive 2 doses of quadrivalent meningococcal conjugate (MenACWY-D) vaccine. The doses should be obtained at least 2 months apart. Microbiologists working with certain meningococcal bacteria, [place] recruits, people at risk during an outbreak, and people who travel to or live in countries with a high rate of meningitis should be immunized. A first-year college student up through age 22 years who is living in a residence hall should receive a dose if she did not receive a dose on or after her 16th birthday. Adults who have certain high-risk conditions should receive one or more doses of vaccine.  Hepatitis A vaccine. Adults who wish to be protected from this disease, have certain high-risk conditions, work with hepatitis A-infected animals, work in hepatitis A research labs,  or travel to or work in countries with a high rate of hepatitis A should be immunized. Adults who were previously unvaccinated and who anticipate close contact with an international adoptee during the first 60 days after arrival in the [place] from a country with a high rate of hepatitis A should be immunized.  Hepatitis B vaccine.  Adults who wish to be protected from this disease,  have certain high-risk conditions, may be exposed to blood or other infectious body fluids, are household contacts or sex partners of hepatitis B positive people, are clients or workers in certain care facilities, or travel to or work in countries with a high rate of hepatitis B should be immunized.  Haemophilus influenzae type b (Hib) vaccine. A previously unvaccinated person with asplenia or sickle cell disease or having a scheduled splenectomy should receive 1 dose of Hib vaccine. Regardless of previous immunization, a recipient of a hematopoietic stem cell transplant should receive a 3-dose series 6 12 months after her successful transplant. Hib vaccine is not recommended for adults with HIV infection.  Preventive Services / Frequency Ages 3 to 39years  Blood pressure check.** / Every 1 to 2 years.  Lipid and cholesterol check.** / Every 5 years beginning at age 6.  Clinical breast exam.** / Every 3 years for women in their 53s and 86s.  BRCA-related cancer risk assessment.** / For women who have family members with a BRCA-related cancer (breast, ovarian, tubal, or peritoneal cancers).  Pap test.** / Every 2 years from ages 69 through 12. Every 3 years starting at age 24 through age 5 or 62 with a history of 3 consecutive normal Pap tests.  HPV screening.** / Every 3 years from ages 67 through ages 75 to 9 with a history of 3 consecutive normal Pap tests.  Hepatitis C blood test.** / For any individual with known risks for hepatitis C.  Skin self-exam. / Monthly.  Influenza vaccine. / Every year.  Tetanus, diphtheria, and acellular pertussis (Tdap, Td) vaccine.** / Consult your health care provider. Pregnant women should receive 1 dose of Tdap vaccine during each pregnancy. 1 dose of Td every 10 years.  Varicella vaccine.** / Consult your health care provider. Pregnant females who do not have evidence of immunity should receive the first dose after pregnancy.  HPV vaccine. / 3  doses over 6 months, if 79 and younger. The vaccine is not recommended for use in pregnant females. However, pregnancy testing is not needed before receiving a dose.  Measles, mumps, rubella (MMR) vaccine.** / You need at least 1 dose of MMR if you were born in 1957 or later. You may also need a 2nd dose. For females of childbearing age, rubella immunity should be determined. If there is no evidence of immunity, females who are not pregnant should be vaccinated. If there is no evidence of immunity, females who are pregnant should delay immunization until after pregnancy.  Pneumococcal 13-valent conjugate (PCV13) vaccine.** / Consult your health care provider.  Pneumococcal polysaccharide (PPSV23) vaccine.** / 1 to 2 doses if you smoke cigarettes or if you have certain conditions.  Meningococcal vaccine.** / 1 dose if you are age 64 to 56 years and a Market researcher living in a residence hall, or have one of several medical conditions, you need to get vaccinated against meningococcal disease. You may also need additional booster doses.  Hepatitis A vaccine.** / Consult your health care provider.  Hepatitis B vaccine.** / Consult your health care provider.  Haemophilus  influenzae type b (Hib) vaccine.** / Consult your health care provider.  Ages 53 to 64years  Blood pressure check.** / Every 1 to 2 years.  Lipid and cholesterol check.** / Every 5 years beginning at age 76 years.  Lung cancer screening. / Every year if you are aged 58 80 years and have a 30-pack-year history of smoking and currently smoke or have quit within the past 15 years. Yearly screening is stopped once you have quit smoking for at least 15 years or develop a health problem that would prevent you from having lung cancer treatment.  Clinical breast exam.** / Every year after age 47 years.  BRCA-related cancer risk assessment.** / For women who have family members with a BRCA-related cancer (breast, ovarian,  tubal, or peritoneal cancers).  Mammogram.** / Every year beginning at age 37 years and continuing for as long as you are in good health. Consult with your health care provider.  Pap test.** / Every 3 years starting at age 32 years through age 43 or 73 years with a history of 3 consecutive normal Pap tests.  HPV screening.** / Every 3 years from ages 2 years through ages 52 to 66 years with a history of 3 consecutive normal Pap tests.  Fecal occult blood test (FOBT) of stool. / Every year beginning at age 12 years and continuing until age 62 years. You may not need to do this test if you get a colonoscopy every 10 years.  Flexible sigmoidoscopy or colonoscopy.** / Every 5 years for a flexible sigmoidoscopy or every 10 years for a colonoscopy beginning at age 1 years and continuing until age 20 years.  Hepatitis C blood test.** / For all people born from [birth year] through 1967 and any individual with known risks for hepatitis C.  Skin self-exam. / Monthly.  Influenza vaccine. / Every year.  Tetanus, diphtheria, and acellular pertussis (Tdap/Td) vaccine.** / Consult your health care provider. Pregnant women should receive 1 dose of Tdap vaccine during each pregnancy. 1 dose of Td every 10 years.  Varicella vaccine.** / Consult your health care provider. Pregnant females who do not have evidence of immunity should receive the first dose after pregnancy.  Zoster vaccine.** / 1 dose for adults aged 103 years or older.  Measles, mumps, rubella (MMR) vaccine.** / You need at least 1 dose of MMR if you were born in 1957 or later. You may also need a 2nd dose. For females of childbearing age, rubella immunity should be determined. If there is no evidence of immunity, females who are not pregnant should be vaccinated. If there is no evidence of immunity, females who are pregnant should delay immunization until after pregnancy.  Pneumococcal 13-valent conjugate (PCV13) vaccine.** / Consult your health  care provider.  Pneumococcal polysaccharide (PPSV23) vaccine.** / 1 to 2 doses if you smoke cigarettes or if you have certain conditions.  Meningococcal vaccine.** / Consult your health care provider.  Hepatitis A vaccine.** / Consult your health care provider.  Hepatitis B vaccine.** / Consult your health care provider.  Haemophilus influenzae type b (Hib) vaccine.** / Consult your health care provider.  Ages 13 years and over  Blood pressure check.** / Every 1 to 2 years.  Lipid and cholesterol check.** / Every 5 years beginning at age 29 years.  Lung cancer screening. / Every year if you are aged 31 80 years and have a 30-pack-year history of smoking and currently smoke or have quit within the past 15 years. Yearly screening is  stopped once you have quit smoking for at least 15 years or develop a health problem that would prevent you from having lung cancer treatment.  Clinical breast exam.** / Every year after age 30 years.  BRCA-related cancer risk assessment.** / For women who have family members with a BRCA-related cancer (breast, ovarian, tubal, or peritoneal cancers).  Mammogram.** / Every year beginning at age 28 years and continuing for as long as you are in good health. Consult with your health care provider.  Pap test.** / Every 3 years starting at age 57 years through age 66 or 58 years with 3 consecutive normal Pap tests. Testing can be stopped between 65 and 70 years with 3 consecutive normal Pap tests and no abnormal Pap or HPV tests in the past 10 years.  HPV screening.** / Every 3 years from ages 61 years through ages 80 or 65 years with a history of 3 consecutive normal Pap tests. Testing can be stopped between 65 and 70 years with 3 consecutive normal Pap tests and no abnormal Pap or HPV tests in the past 10 years.  Fecal occult blood test (FOBT) of stool. / Every year beginning at age 84 years and continuing until age 39 years. You may not need to do this test if  you get a colonoscopy every 10 years.  Flexible sigmoidoscopy or colonoscopy.** / Every 5 years for a flexible sigmoidoscopy or every 10 years for a colonoscopy beginning at age 33 years and continuing until age 50 years.  Hepatitis C blood test.** / For all people born from [birth year] through 1967 and any individual with known risks for hepatitis C.  Osteoporosis screening.** / A one-time screening for women ages 46 years and over and women at risk for fractures or osteoporosis.  Skin self-exam. / Monthly.  Influenza vaccine. / Every year.  Tetanus, diphtheria, and acellular pertussis (Tdap/Td) vaccine.** / 1 dose of Td every 10 years.  Varicella vaccine.** / Consult your health care provider.  Zoster vaccine.** / 1 dose for adults aged 77 years or older.  Pneumococcal 13-valent conjugate (PCV13) vaccine.** / Consult your health care provider.  Pneumococcal polysaccharide (PPSV23) vaccine.** / 1 dose for all adults aged 33 years and older.  Meningococcal vaccine.** / Consult your health care provider.  Hepatitis A vaccine.** / Consult your health care provider.  Hepatitis B vaccine.** / Consult your health care provider.  Haemophilus influenzae type b (Hib) vaccine.** / Consult your health care provider. ** Family history and personal history of risk and conditions may change your health care provider's recommendations. Document Released: 05/27/2001 Document Revised: 01/19/2013  [facility] Patient Information 2014 [place], [place].   EXERCISE AND DIET:  We recommended that you start or continue a regular exercise program for good health. Regular exercise means any activity that makes your heart beat faster and makes you sweat.  We recommend exercising at least 30 minutes per day at least 3 days a week, preferably 5.  We also recommend a diet low in fat and sugar / carbohydrates.  Inactivity, poor dietary choices and obesity can cause diabetes, heart attack, stroke, and kidney damage, among  others.     ALCOHOL AND SMOKING:  Women should limit their alcohol intake to no more than 7 drinks/beers/glasses of wine (combined, not each!) per week. Moderation of alcohol intake to this level decreases your risk of breast cancer and liver damage.  ( And of course, no recreational drugs are part of a healthy lifestyle.)  Also, you should not  be smoking at all or even being exposed to second hand smoke. Most people know smoking can cause cancer, and various heart and lung diseases, but did you know it also contributes to weakening of your bones?  Aging of your skin?  Yellowing of your teeth and nails?   CALCIUM AND VITAMIN D:  Adequate intake of calcium and Vitamin D are recommended.  The recommendations for exact amounts of these supplements seem to change often, but generally speaking 600 mg of calcium (either carbonate or citrate) and 800 units of Vitamin D per day seems prudent. Certain women may benefit from higher intake of Vitamin D.  If you are among these women, your doctor will have told you during your visit.     PAP SMEARS:  Pap smears, to check for cervical cancer or precancers,  have traditionally been done yearly, although recent scientific advances have shown that most women can have pap smears less often.  However, every woman still should have a physical exam from her gynecologist or primary care physician every year. It will include a breast check, inspection of the vulva and vagina to check for abnormal growths or skin changes, a visual exam of the cervix, and then an exam to evaluate the size and shape of the uterus and ovaries.  And after 45 years of age, a rectal exam is indicated to check for rectal cancers. We will also provide age appropriate advice regarding health maintenance, like when you should have certain vaccines, screening for sexually transmitted diseases, bone density testing, colonoscopy, mammograms, etc.    MAMMOGRAMS:  All women over 24 years old should have  a yearly mammogram. Many facilities now offer a "3D" mammogram, which may cost around $50 extra out of pocket. If possible,  we recommend you accept the option to have the 3D mammogram performed.  It both reduces the number of women who will be called back for extra views which then turn out to be normal, and it is better than the routine mammogram at detecting truly abnormal areas.     COLONOSCOPY:  Colonoscopy to screen for colon cancer is recommended for all women at age 65.  We know, you hate the idea of the prep.  We agree, BUT, having colon cancer and not knowing it is worse!!  Colon cancer so often starts as a polyp that can be seen and removed at colonscopy, which can quite literally save your life!  And if your first colonoscopy is normal and you have no family history of colon cancer, most women don't have to have it again for 10 years.  Once every ten years, you can do something that may end up saving your life, right?  We will be happy to help you get it scheduled when you are ready.  Be sure to check your insurance coverage so you understand how much it will cost.  It may be covered as a preventative service at no cost, but you should check your particular policy.

## 2018-12-23 NOTE — Progress Notes (Signed)
Impression and Recommendations:    1. Encounter for wellness examination   2. Counseling on health promotion and disease prevention   3. Fibrocystic breast disease (FCBD), unspecified laterality   4. History of hysterectomy for benign disease (DUB)      1) Anticipatory Guidance: Discussed importance of wearing a seatbelt while driving, not texting while driving; sunscreen when outside along with yearly skin surveillance; eating a well balanced and modest diet; physical activity at least 25 minutes per day or 150 min/ week of moderate to intense activity.  - Prudent skin screening habits discussed in detail with patient.  2) Immunizations / Screenings / Labs:  All immunizations and screenings that patient agrees to, are up-to-date per recommendations or will be updated today.  Patient understands the needs for q 10mo dental and yearly vision screens which pt will schedule independently. Obtain CBC, CMP, HgA1c, Lipid panel, TSH and vit D when fasting if not already done recently.   - Need for lab work.  Drawn today.  - Need for influenza vaccination. - Education provided today regarding need for flu vaccination during COVID.  3) Weight & BMI Counseling - Body mass index is 25.35 kg/m Discussed goal of losing even 5-10% of current body weight which would improve overall feelings of well being and improve objective health data significantly.   Improve nutrient density of diet through increasing intake of fruits and vegetables and decreasing saturated/trans fats, white flour products and refined sugar products.   Explained to patient what BMI refers to, and what it means medically.    Told patient to think about it as a "medical risk stratification measurement" and how increasing BMI is associated with increasing risk/ or worsening state of various diseases such as hypertension, hyperlipidemia, diabetes, premature OA, depression etc.  - If patient desires to lose the 15 lbs she has  gained in the past 2-3 years, discussed that she may lose 1 pound per week if desired.  4) Health Counseling & Preventative Health Maintenance - Advised patient to continue working toward physical conditioning to improve overall mental, physical, and emotional health.  Discussed various options to find workout routines.  - Encouraged patient to establish a schedule to keep herself active in retirement. - Encouraged increased intensity of workout and prudent practices/education was provided.  - Reviewed the "spokes of the wheel" of mood and health management.  Stressed the importance of ongoing prudent habits, including regular exercise, appropriate sleep hygiene, healthful dietary habits, and prayer/meditation to relax.  - Encouraged patient to engage in daily physical activity, especially a formal exercise routine.  Recommended that the patient eventually strive for at least 150 minutes of moderate cardiovascular activity per week according to guidelines established by the [facility].   - Healthy dietary habits encouraged, including low-carb, and high amounts of lean protein in diet.   - Patient should also consume adequate amounts of water.  - Health counseling performed.  All questions answered.  Recommendations - Advised follow-up in six months.   No orders of the defined types were placed in this encounter.   No orders of the defined types were placed in this encounter.   Gross side effects, risk and benefits, and alternatives of medications discussed with patient.  Patient is aware that all medications have potential side effects and we are unable to predict every side effect or drug-drug interaction that may occur.  Expresses verbal understanding and consents to current therapy plan and treatment regimen.  F-up preventative CPE in 1  year. F/up sooner for chronic care management as discussed and/or prn.  Please see orders placed and AVS handed out to patient at the end of our visit for  further patient instructions/ counseling done pertaining to today's office visit.   This document serves as a record of services personally performed by Thomasene Loteborah Berea Majkowski, DO. It was created on her behalf by Peggye FothergillKatherine Galloway, a trained medical scribe. The creation of this record is based on the scribe's personal observations and the provider's statements to them.   I have reviewed the above medical documentation for accuracy and completeness and I concur.  Thomasene Loteborah Geral Tuch, DO 12/24/2018 10:21 AM        Subjective:    Chief Complaint  Patient presents with  . Annual Exam    no problems or concerns   HPI: Brianna Ball is a 43 y.o. female who [facility] a yearly health maintenance exam.  Health Maintenance Summary Reviewed and updated, unless pt declines services.  Colonoscopy:  Denies family history of first degree relative with colon cancer. Tobacco History Reviewed:   Y  Alcohol:    No concerns, no excessive use Exercise Habits:   Not meeting AHA guidelines.  Notes that she has a treadmill she uses to walk, but she has "gained weight, a lot."  Says "I've gained quite a bit" in the past 2-3 years (15 lbs). STD concerns:   none Drug Use:   None Birth control method:  History of hysterectomy. Menses regular:   History of hysterectomy. Lumps or breast concerns:   No concerns reported. Breast Cancer Family History:  Mom at age 45, diagnosed with breast cancer.  She had a double mastectomy.  Patient has been going for routine mammograms since age 43.    Notes her family went to the beach recently.  Says she just took early retirement.  Notes her mom isn't doing well and needs extra care, which is why she has retired from her former occupation.  Notes this is her first week of taking early retirement.  Patient states she's concerned about ongoing weight gain during early retirement.  Dermatological Health Has a dermatologist but  doesn't see him regularly because she hasn't had problems. Notes no new moles that have changed.  Female Health Has an OBGYN. Sees Dr. Renaldo FiddlerAdkins.  Eye Health States she has not had an eye exam recently.  Dental Health Continues going to the dentist every six months.  Denies chest pain, SOB, new GI symptoms, new dizziness, new vision changes, new neurological problems.  Denies new diarrhea or constipation.  Notes had herniated neck surgery before she began following up here at the clinic.     Immunization History  Administered Date(s) Administered  . Tdap 11/02/2015    Health Maintenance  Topic Date Due  . HIV Screening  05/02/1988  . PAP SMEAR-Modifier  05/02/1994  . INFLUENZA VACCINE  11/13/2018  . TETANUS/TDAP  11/01/2025     Wt Readings from Last 3 Encounters:  12/23/18 138 lb 9.6 oz (62.9 kg)  12/05/15 123 lb 4.8 oz (55.9 kg)  11/02/15 125 lb (56.7 kg)   BP Readings from Last 3 Encounters:  12/23/18 131/66  12/05/15 132/79  11/02/15 131/72   Pulse Readings from Last 3 Encounters:  12/23/18 63  12/05/15 66  11/02/15 67     Past Medical History:  Diagnosis Date  . h/o Adjustment disorder with mixed anxiety and depressed mood 08/18/2007   Qualifier:  Diagnosis of  By: Ronnald Ramp CMA, Chemira    . h/o Cervicalgia 11/02/2015  . History of hysterectomy for benign disease (DUB) 11/01/2000      Past Surgical History:  Procedure Laterality Date  . ABDOMINAL HYSTERECTOMY  2007  . [place] SURGERY  2016  . TUBAL LIGATION  2000      Family History  Problem Relation Age of Onset  . Hypertension Mother       Social History   Substance and Sexual Activity  Drug Use No  ,   Social History   Substance and Sexual Activity  Alcohol Use No  ,   Social History   Tobacco Use  Smoking Status Never Smoker  Smokeless Tobacco Never Used  ,   Social History   Substance and Sexual Activity  Sexual Activity Yes    Current Outpatient Medications on  File Prior to Visit  Medication Sig Dispense Refill  . Multiple Vitamins-Minerals (MULTIVITAMIN WITH MINERALS) tablet Take 1 tablet by mouth daily.     No current facility-administered medications on file prior to visit.     Allergies: Codeine  Review of Systems: General:   Denies fever, chills, unexplained weight loss.  Optho/Auditory:   Denies visual changes, blurred vision/LOV Respiratory:   Denies SOB, DOE more than baseline levels.  Cardiovascular:   Denies chest pain, palpitations, new onset peripheral edema  Gastrointestinal:   Denies nausea, vomiting, diarrhea.  Genitourinary: Denies dysuria, freq/ urgency, flank pain or discharge from genitals.  Endocrine:     Denies hot or cold intolerance, polyuria, polydipsia. Musculoskeletal:   Denies unexplained myalgias, joint swelling, unexplained arthralgias, gait problems.  Skin:  Denies rash, suspicious lesions Neurological:     Denies dizziness, unexplained weakness, numbness  Psychiatric/Behavioral:   Denies mood changes, suicidal or homicidal ideations, hallucinations    Objective:    Blood pressure 131/66, pulse 63, temperature 98.8 F (37.1 C), resp. rate 16, height 5\' 2"  (1.575 m), weight 138 lb 9.6 oz (62.9 kg), SpO2 100 %. Body mass index is 25.35 kg/m. General Appearance:    Alert, cooperative, no distress, appears stated age  Head:    Normocephalic, without obvious abnormality, atraumatic  Eyes:    PERRL, conjunctiva/corneas clear, EOM's intact, fundi    benign, both eyes  Ears:    Normal TM's and external ear canals, both ears  Nose:   Nares normal, septum midline, mucosa normal, no drainage    or sinus tenderness  Throat:   Lips w/o lesion, mucosa moist, and tongue normal; teeth and   gums normal  Neck:   Supple, symmetrical, trachea midline, no adenopathy;    thyroid:  no enlargement/tenderness/nodules; no carotid   bruit or JVD  Back:     Symmetric, no curvature, ROM normal, no CVA tenderness  Lungs:      Clear to auscultation bilaterally, respirations unlabored, no       Wh/ R/ R  Chest Wall:    No tenderness or gross deformity; normal excursion   Heart:    Regular rate and rhythm, S1 and S2 normal, no murmur, rub   or gallop  Breast Exam:    Deferred to OBGYN.  Abdomen:     Soft, non-tender, bowel sounds active all four quadrants, NO   G/R/R, no masses, no organomegaly  Genitalia:    Deferred to OBGYN.  Rectal:    Deferred to OBGYN.  Extremities:   Extremities normal, atraumatic, no cyanosis or gross edema  Pulses:   2+ and symmetric  all extremities  Skin:   Warm, dry, Skin color, texture, turgor normal, no obvious rashes or lesions Psych: No HI/SI, judgement and insight good, Euthymic mood. Full Affect.  Neurologic:   CNII-XII intact, normal strength, sensation and reflexes    Throughout

## 2018-12-24 LAB — COMPREHENSIVE METABOLIC PANEL
ALT: 9 IU/L (ref 0–32)
AST: 12 IU/L (ref 0–40)
Albumin/Globulin Ratio: 2 (ref 1.2–2.2)
Albumin: 4.3 g/dL (ref 3.8–4.8)
Alkaline Phosphatase: 57 IU/L (ref 39–117)
BUN/Creatinine Ratio: 11 (ref 9–23)
BUN: 10 mg/dL (ref 6–24)
Bilirubin Total: 0.4 mg/dL (ref 0.0–1.2)
CO2: 22 mmol/L (ref 20–29)
Calcium: 9.2 mg/dL (ref 8.7–10.2)
Chloride: 108 mmol/L — ABNORMAL HIGH (ref 96–106)
Creatinine, Ser: 0.91 mg/dL (ref 0.57–1.00)
GFR calc Af Amer: 88 mL/min/{1.73_m2} (ref >59)
GFR calc non Af Amer: 76 mL/min/{1.73_m2} (ref >59)
Globulin, Total: 2.2 g/dL (ref 1.5–4.5)
Glucose: 88 mg/dL (ref 65–99)
Potassium: 4.4 mmol/L (ref 3.5–5.2)
Sodium: 142 mmol/L (ref 134–144)
Total Protein: 6.5 g/dL (ref 6.0–8.5)

## 2018-12-24 LAB — HEMOGLOBIN A1C
Est. average glucose Bld gHb Est-mCnc: 94 mg/dL
Hgb A1c MFr Bld: 4.9 % (ref 4.8–5.6)

## 2018-12-24 LAB — CBC WITH DIFFERENTIAL/PLATELET
Basophils Absolute: 0.1 10*3/uL (ref 0.0–0.2)
Basos: 1 %
EOS (ABSOLUTE): 0.1 10*3/uL (ref 0.0–0.4)
Eos: 2 %
Hematocrit: 38.6 % (ref 34.0–46.6)
Hemoglobin: 12.9 g/dL (ref 11.1–15.9)
Immature Grans (Abs): 0 10*3/uL (ref 0.0–0.1)
Immature Granulocytes: 0 %
Lymphocytes Absolute: 2.5 10*3/uL (ref 0.7–3.1)
Lymphs: 37 %
MCH: 29.8 pg (ref 26.6–33.0)
MCHC: 33.4 g/dL (ref 31.5–35.7)
MCV: 89 fL (ref 79–97)
Monocytes Absolute: 0.5 10*3/uL (ref 0.1–0.9)
Monocytes: 7 %
Neutrophils Absolute: 3.4 10*3/uL (ref 1.4–7.0)
Neutrophils: 53 %
Platelets: 261 10*3/uL (ref 150–450)
RBC: 4.33 x10E6/uL (ref 3.77–5.28)
RDW: 12.2 % (ref 11.7–15.4)
WBC: 6.6 10*3/uL (ref 3.4–10.8)

## 2018-12-24 LAB — LIPID PANEL
Chol/HDL Ratio: 3.5 ratio (ref 0.0–4.4)
Cholesterol, Total: 215 mg/dL — ABNORMAL HIGH (ref 100–199)
HDL: 62 mg/dL (ref >39)
LDL Chol Calc (NIH): 141 mg/dL — ABNORMAL HIGH (ref 0–99)
Triglycerides: 70 mg/dL (ref 0–149)
VLDL Cholesterol Cal: 12 mg/dL (ref 5–40)

## 2018-12-24 LAB — T3: T3, Total: 117 ng/dL (ref 71–180)

## 2018-12-24 LAB — T4, FREE: Free T4: 1.08 ng/dL (ref 0.82–1.77)

## 2018-12-24 LAB — VITAMIN D 25 HYDROXY (VIT D DEFICIENCY, FRACTURES): Vit D, 25-Hydroxy: 28.9 ng/mL — ABNORMAL LOW (ref 30.0–100.0)

## 2018-12-24 LAB — TSH: TSH: 3.73 u[IU]/mL (ref 0.450–4.500)

## 2019-01-25 ENCOUNTER — Emergency Department (HOSPITAL_BASED_OUTPATIENT_CLINIC_OR_DEPARTMENT_OTHER)
Admission: EM | Admit: 2019-01-25 | Discharge: 2019-01-25 | Disposition: A | Discharge status: Home / Self Care | Payer: 59 | Attending: Emergency Medicine | Admitting: Emergency Medicine

## 2019-01-25 ENCOUNTER — Other Ambulatory Visit: Payer: Self-pay

## 2019-01-25 ENCOUNTER — Encounter (HOSPITAL_BASED_OUTPATIENT_CLINIC_OR_DEPARTMENT_OTHER): Payer: Self-pay | Admitting: Emergency Medicine

## 2019-01-25 DIAGNOSIS — M5431 Sciatica, right side: Secondary | ICD-10-CM | POA: Insufficient documentation

## 2019-01-25 DIAGNOSIS — M79604 Pain in right leg: Secondary | ICD-10-CM | POA: Diagnosis present

## 2019-01-25 DIAGNOSIS — Z79899 Other long term (current) drug therapy: Secondary | ICD-10-CM | POA: Diagnosis not present

## 2019-01-25 MED ORDER — DIAZEPAM 5 MG PO TABS
5.0000 mg | ORAL_TABLET | Freq: Once | ORAL | Status: AC
  Administered 2019-01-25: 5 mg via ORAL
  Filled 2019-01-25: qty 1

## 2019-01-25 MED ORDER — ACETAMINOPHEN 500 MG PO TABS
1000.0000 mg | ORAL_TABLET | Freq: Once | ORAL | Status: AC
  Administered 2019-01-25: 1000 mg via ORAL
  Filled 2019-01-25: qty 2

## 2019-01-25 MED ORDER — OXYCODONE HCL 5 MG PO TABS
5.0000 mg | ORAL_TABLET | Freq: Once | ORAL | Status: AC
  Administered 2019-01-25: 5 mg via ORAL
  Filled 2019-01-25: qty 1

## 2019-01-25 MED ORDER — KETOROLAC TROMETHAMINE 15 MG/ML IJ SOLN
15.0000 mg | Freq: Once | INTRAMUSCULAR | Status: AC
  Administered 2019-01-25: 15 mg via INTRAMUSCULAR
  Filled 2019-01-25: qty 1

## 2019-01-25 NOTE — ED Triage Notes (Signed)
Right leg pain with n/t. Chronic problem. Worse today. Able to ambulate. States unable to see nuerosurgeon today. Denies injury.

## 2019-01-25 NOTE — ED Notes (Signed)
ED Provider at bedside. 

## 2019-01-25 NOTE — Discharge Instructions (Signed)

## 2019-01-25 NOTE — ED Provider Notes (Signed)
[facility] EMERGENCY DEPARTMENT Provider Note   CSN: [id number] Arrival date & time: 01/25/19  2103     History   Chief Complaint Chief Complaint  Patient presents with  . Leg Pain    HPI Brianna Ball is a 43 y.o. female.     [age] yo F with chief complaint of right sided low back pain.  Radiates down the leg to the foot.  Going on for the past few months.  No obvious initial injury.  Had worsening a couple weeks ago after sleeping on an air mattress.  No fevers, no hx of cancer, no numbness, weakness.  No loss of bowel or bladder. No loss of perirectal sensation.     The history is provided by the patient.  Leg Pain Location:  Buttock and leg Time since incident:  2 weeks Injury: no   Buttock location:  R buttock Leg location:  R leg Pain details:    Quality:  Shooting and sharp   Radiates to: foot.   Severity:  Severe   Onset quality:  Gradual   Duration:  2 months   Timing:  Constant   Progression:  Worsening Dislocation: no   Foreign body present:  No foreign bodies Prior injury to area:  No Relieved by:  Nothing Worsened by:  Bearing weight Ineffective treatments:  None tried Associated symptoms: back pain   Associated symptoms: no fever     Past Medical History:  Diagnosis Date  . h/o Adjustment disorder with mixed anxiety and depressed mood 08/18/2007   Qualifier: Diagnosis of  By: Yetta BarreJones CMA, Chemira    . h/o Cervicalgia 11/02/2015  . History of hysterectomy for benign disease (DUB) 11/01/2000    Patient Active Problem List   Diagnosis Date Noted  . Vitamin D insufficiency 12/05/2015  . High serum high density lipoprotein (HDL) 12/05/2015  . h/o Cervicalgia 11/02/2015  . Counseling on health promotion and disease prevention 11/02/2015  . h/o Cervical disc w radiculopathy- s/p decompress; ant approach-Dr Jeral FruitBotero 11/02/2015  . b/l fibrocystic breast dx 08/19/2007  . h/o Adjustment disorder with mixed anxiety and depressed mood 08/18/2007  .  History of hysterectomy for benign disease (DUB) 11/01/2000    Past Surgical History:  Procedure Laterality Date  . ABDOMINAL HYSTERECTOMY  2007  . CERVICAL DISC SURGERY  2016  . TUBAL LIGATION  2000     OB History   No obstetric history on file.      Home Medications    Prior to Admission medications   Medication Sig Start Date End Date Taking? Authorizing Provider  Multiple Vitamins-Minerals (MULTIVITAMIN WITH MINERALS) tablet Take 1 tablet by mouth daily.    [provider]    Family History Family History  Problem Relation Age of Onset  . Hypertension Mother     Social History Social History   Tobacco Use  . Smoking status: Never Smoker  . Smokeless tobacco: Never Used  Substance Use Topics  . Alcohol use: No  . Drug use: No     Allergies   Codeine   Review of Systems Review of Systems  Constitutional: Negative for chills and fever.  HENT: Negative for congestion and rhinorrhea.   Eyes: Negative for redness and visual disturbance.  Respiratory: Negative for shortness of breath and wheezing.   Cardiovascular: Negative for chest pain and palpitations.  Gastrointestinal: Negative for nausea and vomiting.  Genitourinary: Negative for dysuria and urgency.  Musculoskeletal: Positive for arthralgias and back pain. Negative for myalgias.  Skin: Negative for pallor and wound.  Neurological: Negative for dizziness and headaches.     Physical Exam Updated Vital Signs BP (!) 162/95 (BP Location: Right Arm)   Pulse 86   Temp 99.2 F (37.3 C) (Oral)   Resp 16   Ht 5\' 1"  (1.549 m)   Wt 61.2 kg   SpO2 99%   BMI 25.51 kg/m   Physical Exam Vitals signs and nursing note reviewed.  Constitutional:      General: She is not in acute distress.    Appearance: She is well-developed. She is not diaphoretic.  HENT:     Head: Normocephalic and atraumatic.  Eyes:     Pupils: Pupils are equal, round, and reactive to light.  Neck:     Musculoskeletal:  Normal range of motion and neck supple.  Cardiovascular:     Rate and Rhythm: Normal rate and regular rhythm.     Heart sounds: No murmur. No friction rub. No gallop.   Pulmonary:     Effort: Pulmonary effort is normal.     Breath sounds: No wheezing or rales.  Abdominal:     General: There is no distension.     Palpations: Abdomen is soft.     Tenderness: There is no abdominal tenderness.  Musculoskeletal:        General: No tenderness.     Comments: Patient points to the right SI joint area as location of pain.  No midline spinal tenderness.  No piriformis tenderness.  Positive straight leg raise test.  Pulse motor and sensation are intact distally.  Reflexes are 2+ and equal to the other side.  No clonus.  No appreciable weakness.  Skin:    General: Skin is warm and dry.  Neurological:     Mental Status: She is alert and oriented to person, place, and time.  Psychiatric:        Behavior: Behavior normal.      ED Treatments / Results  Labs (all labs ordered are listed, but only abnormal results are displayed) Labs Reviewed - No data to display  EKG None  Radiology No results found.  Procedures Procedures (including critical care time)  Medications Ordered in ED Medications  acetaminophen (TYLENOL) tablet 1,000 mg (1,000 mg Oral Given 01/25/19 2255)  ketorolac (TORADOL) 15 MG/ML injection 15 mg (15 mg Intramuscular Given 01/25/19 2256)  oxyCODONE (Oxy IR/ROXICODONE) immediate release tablet 5 mg (5 mg Oral Given 01/25/19 2255)  diazepam (VALIUM) tablet 5 mg (5 mg Oral Given 01/25/19 2255)     Initial Impression / Assessment and Plan / ED Course  I have reviewed the triage vital signs and the nursing notes.  Pertinent labs & imaging results that were available during my care of the patient were reviewed by me and considered in my medical decision making (see chart for details).        43 yo F with a chief complaints of right-sided sciatica.  Going on for the  past couple months and worsening over the past couple weeks after sleeping on an air mattress.  She is well-appearing and nontoxic.  Has no red flags.  Exam without neurologic deficit.  Will treat symptomatically here.  PCP follow-up.  11:11 PM:  I have discussed the diagnosis/risks/treatment options with the patient and believe the pt to be eligible for discharge home to follow-up with PCP. We also discussed returning to the ED immediately if new or worsening sx occur. We discussed the sx which are most concerning (e.g.,  sudden worsening pain, fever, inability to tolerate by mouth) that necessitate immediate return. Medications administered to the patient during their visit and any new prescriptions provided to the patient are listed below.  Medications given during this visit Medications  acetaminophen (TYLENOL) tablet 1,000 mg (1,000 mg Oral Given 01/25/19 2255)  ketorolac (TORADOL) 15 MG/ML injection 15 mg (15 mg Intramuscular Given 01/25/19 2256)  oxyCODONE (Oxy IR/ROXICODONE) immediate release tablet 5 mg (5 mg Oral Given 01/25/19 2255)  diazepam (VALIUM) tablet 5 mg (5 mg Oral Given 01/25/19 2255)     The patient appears reasonably screen and/or stabilized for discharge and I doubt any other medical condition or other [facility] requiring further screening, evaluation, or treatment in the ED at this time prior to discharge.    Final Clinical Impressions(s) / ED Diagnoses   Final diagnoses:  Sciatica of right side    ED Discharge Orders    None       Melene Plan, DO 01/25/19 2311

## 2019-01-25 NOTE — ED Triage Notes (Signed)
Took 800 mg of ibuprofen at 1600 without relief.

## 2019-02-03 ENCOUNTER — Other Ambulatory Visit: Payer: Self-pay

## 2019-02-03 ENCOUNTER — Encounter: Payer: Self-pay | Admitting: Family Medicine

## 2019-02-03 ENCOUNTER — Ambulatory Visit (INDEPENDENT_AMBULATORY_CARE_PROVIDER_SITE_OTHER): Payer: 59 | Admitting: Family Medicine

## 2019-02-03 VITALS — BP 143/84 | HR 79 | Temp 98.6°F | Resp 14 | Ht 62.0 in | Wt 139.1 lb

## 2019-02-03 DIAGNOSIS — M5441 Lumbago with sciatica, right side: Secondary | ICD-10-CM | POA: Diagnosis not present

## 2019-02-03 DIAGNOSIS — Z7189 Other specified counseling: Secondary | ICD-10-CM

## 2019-02-03 DIAGNOSIS — Z09 Encounter for follow-up examination after completed treatment for conditions other than malignant neoplasm: Secondary | ICD-10-CM | POA: Diagnosis not present

## 2019-02-03 DIAGNOSIS — T148XXA Other injury of unspecified body region, initial encounter: Secondary | ICD-10-CM

## 2019-02-03 DIAGNOSIS — F4323 Adjustment disorder with mixed anxiety and depressed mood: Secondary | ICD-10-CM | POA: Diagnosis not present

## 2019-02-03 MED ORDER — PREDNISONE 20 MG PO TABS: ORAL_TABLET | ORAL | 0 refills | Status: DC

## 2019-02-03 MED ORDER — LIDOCAINE 5 % EX PTCH: 1.0000 | MEDICATED_PATCH | Freq: Two times a day (BID) | CUTANEOUS | 0 refills | Status: DC

## 2019-02-03 MED ORDER — GABAPENTIN 300 MG PO CAPS: ORAL_CAPSULE | ORAL | 0 refills | Status: DC

## 2019-02-03 NOTE — Patient Instructions (Addendum)
Please take yo  Please DO NOT GO TO THE CHiropractor!!!!!

## 2019-02-03 NOTE — Progress Notes (Signed)
Impression and Recommendations:    1. Hospital discharge follow-up   2. Acute right-sided low back pain with right-sided sciatica   3. h/o Adjustment disorder with mixed anxiety and depressed mood   4. Counseling on health promotion and disease prevention   5. Nerve injury     Hospital Discharge f/up - Acute Right-Sided Low Back Pain with Right-Sided Sciatica - Pt went to ER on 01/25/2019 for right-sided low back pain radiating down the leg to her foot.  This pain had acutely worsened two weeks prior to ED visit after pt slept on air mattress.  Pt was told to follow up with PCP for further recommendations.  - Regarding pt's recent hospitalization and/or ED visit: reviewed in great detail recent hospitalization notes, clinical lab tests, tests in the radiology section of [facility], tests in the medicine testing of [facility], and obtained history from patient.  - Serious conversation held with patient during appointment. - Told patient to avoid going to chiropractor with radicular symptoms. -  Patient went to chiropractor named Karsten Fells of [facility].  - Extensively discussed back and radicular sx with patient today. - Education provided, and all questions answered.  - Given radicular symptoms, discussed need for MRI to visualize possible pinched nerves- she can discuss with specialist   - Discussed need for patient to be seen by interventional pain specialist, and/or referred neurosurgeon for further evaluation.  Patient prefers to see the doctor that her family sees   She willl let us know if she has trouble making that appt  - Reviewed prudent use of medications with patient today, to help treat neuropathic pain.  - Discussed that if patient can tolerate Gabapentin, this will assist with neuropathic /radicular symptoms. - Advised patient that she can prudently increase her dose in the morning, afternoon, and evening, depending on how well she can tolerate the medication  from a GI and drowsiness standpoint.  Patient understands prudent use of Gabapentin and need to slowly increase dose.    - New prescription of Gabapentin prescribed today.  See med list.  - Oral steroids provided today (prednisone).  See orders. - Discussed that steroids will help with acute inflammation.  - Lidoderm patch prescribed today for use over area of pain. - Prudent use discussed with patient.  See orders.  - If warm compress on back helps alleviate pain, discussed that patient may use this.  However, recommended ice on the area of pain in her back.  Advised patient to use ice three times per day at least, for 15 minutes at a time.  - Encouraged patient to walk on flat, even ground if tolerated, to help increase blood flow to the affected area and alleviate pain.   Education and routine counseling performed. Handouts provided.   Meds ordered this encounter  Medications  . predniSONE (DELTASONE) 20 MG tablet    Sig: Take 3 pills a day for 2 days, 2 pills a day for 2 days, 1 pill a day for 2 days then one half pill a day for 2 days then off    Dispense:  14 tablet    Refill:  0  . lidocaine (LIDODERM) 5 %    Sig: Place 1 patch onto the skin every 12 (twelve) hours. Remove & Discard patch within 12 hours or as directed by MD    Dispense:  30 patch    Refill:  0  . gabapentin (NEURONTIN) 300 MG capsule    Sig: Take  1 capsule (300 mg total) by mouth 3 (three) times daily for 5 days, THEN 2 capsules (600 mg total) 3 (three) times daily for 5 days, THEN 3 capsules (900 mg total) 3 (three) times daily.    Dispense:  765 capsule    Refill:  0    Orders Placed This Encounter  Procedures  . Ambulatory referral to Neurosurgery    Return if you has lose bowel or bladder- seek urgent care, for for chronic care as previously discussed and prn.   Gross side effects, risk and benefits, and alternatives of medications discussed with patient.  Patient is aware that all medications  have potential side effects and we are unable to predict every sideeffect or drug-drug interaction that may occur.  Expresses verbal understanding and consents to current therapy plan and treatment regiment.  Please see AVS handed out to patient at the end of our visit for further patient instructions/ counseling done pertaining to today's office visit.   This document serves as a record of services personally performed by Thomasene Lot, DO. It was created on her behalf by Peggye Fothergill, a trained medical scribe. The creation of this record is based on the scribe's personal observations and the provider's statements to them.   This case required medical decision making of at least moderate complexity. The above documentation has been reviewed to be accurate and was completed by Carlye Grippe, D.O.     -------------------------------------------------------------------------------------------------------------------------------------------------------------------------------------------------------------------------------------------- Subjective:   CC: BACK PAIN  Says "my family has all had back problems, degenerative disc."  Location: right-sided back. Quality: feels "inflamed and irritated, that anything touching it sends me over."  Onset: gradual with acute onset three weeks ago after sleeping on air mattress one weekend. Worse with: "everything." Better with: resting in bed.  Radiation: "all the way down to my toes, tingling." Trauma: denies injury onset. Best sitting/standing/leaning forward: no  Says she has a herniated disc; notes she went to the ER/Urgent Care, and was told by the doctor there, "as long as you have control of your bowels, you don't have a herniated disc."  Says she received a shot in her butt, and two pain pills and was sent home."  Patient becomes tearful as she describes this.  She called Friday to get an appointment here at clinic and couldn't get  in, so she went to the chiropractor for an X-ray.  She was told her "L1-5 whatever was damaged."  Says "he's been working on me."  Notes "I'm just going insane."  Says the chiropractor tried to adjust her and she "cannot move."  Says she is in "so much pain" and was told to see her PCP for a cortisone shot.  Onset: Says at the beginning of COVID, she was walking 4 miles per day in her neighborhood, and started noticing a "sharp pain going down her leg," and her back started feeling uneasy.  When she came in for her physical, notes the pain had "lessened."  After this, she went and spent the weekend sleeping on an air mattress, and states "from then on is when it started hurting.  The last two weeks it's been so unbearable that I'm taking medicines from my neck surgery just to get me through, and I can't get through."  "it's gotten so bad that I am about ready to 'you know,'" and makes a gesture of cutting her neck.  Says "I wouldn't dare hurt myself, but the pain is almost to a twenty."  Says everything  makes the pain worse, "I breathe, anything using my body, standing, sitting, turning my shoulders."  Says that "resting in bed is the only relief she can get at this point."  Says she has had this sort of pain with her neck, shooting down into her arm.  Thought she would receive an X-ray or MRI at the hospital.  Says she is not sleeping even while taking Valium and Gabapentin from her neck surgery.    Red Flags Fecal/urinary incontinence: no  Numbness/Weakness: yes  Fever/chills/sweats: no  Night pain: no- does not awake her Unexplained weight loss: no  No relief with bedrest: yes  h/o cancer/immunosuppression: no  IV drug use: no  PMH of osteoporosis or chronic steroid use: no    Dep[facility] screen [facility] 02/03/2019 12/23/2018  Decreased Interest 3 0  Down, Depressed, Hopeless 2 0  PHQ - 2 Score 5 0  Altered sleeping 3 3  Tired, decreased energy 3 1  Change in appetite 2 1  Feeling  bad or failure about yourself  0 0  Trouble concentrating 0 0  Moving slowly or fidgety/restless 0 0  Suicidal thoughts 0 0  PHQ-9 Score 13 5  Difficult doing work/chores Very difficult Not difficult at all    Patient Care Team    Relationship Specialty Notifications Start End  Thomasene Lotpalski, Jelisha Weed, DO PCP - General Family Medicine  11/02/15   [place], [facility]    12/23/18     The following portions of the patient's history were reviewed and updated as appropriate: allergies, current medications, past family history, past medical history, past social history, past surgical history and problem list.   Previous Medications   MULTIPLE VITAMINS-MINERALS (MULTIVITAMIN WITH MINERALS) TABLET    Take 1 tablet by mouth daily.    Review of Systems: General:   Denies fever, chills, unexplained weight loss.  Optho/Auditory:   Denies visual changes, blurred vision/LOV Respiratory:   Denies SOB, DOE more than baseline levels.   Cardiovascular:   Denies chest pain, palpitations, new onset peripheral edema  Gastrointestinal:   Denies nausea, vomiting, diarrhea.  Genitourinary: Denies dysuria, freq/ urgency, flank pain or discharge from genitals.  Endocrine:     Denies hot or cold intolerance, polyuria, polydipsia. Musculoskeletal:   + myalgias, denies joint swelling, + arthralgias, + gait problems due to pain.  Skin:  Denies rash, suspicious lesions Neurological:     Denies dizziness, + weakness, + numbness  Psychiatric/Behavioral:   Denies suicidal or homicidal ideations, hallucinations. + mood changes    Objective:  Blood pressure (!) 143/84, pulse 79, temperature 98.6 F (37 C), resp. rate 14, height 5\' 2"  (1.575 m), weight 139 lb 1.6 oz (63.1 kg), SpO2 99 %. Body mass index is 25.44 kg/m.  Gen: A & O *3,  + significant acute distress due to pain HEENT:  Cool/AT Back Exam:  Pt could not tolerate to stand in one place nor could she tolerate sitting on exam bed. Exam limited by  pt intolerance to eval Strength at foot  Exam limited by pt intolerance to eval

## 2019-02-05 ENCOUNTER — Encounter: Payer: Self-pay | Admitting: Family Medicine

## 2019-02-18 ENCOUNTER — Other Ambulatory Visit (HOSPITAL_COMMUNITY): Payer: Self-pay | Admitting: Neurological Surgery

## 2019-02-18 ENCOUNTER — Ambulatory Visit (HOSPITAL_COMMUNITY)
Admission: RE | Admit: 2019-02-18 | Discharge: 2019-02-18 | Disposition: A | Discharge status: Home / Self Care | Payer: 59 | Source: Ambulatory Visit | Attending: Family | Admitting: Family

## 2019-02-18 ENCOUNTER — Encounter (HOSPITAL_COMMUNITY): Payer: 59

## 2019-02-18 ENCOUNTER — Other Ambulatory Visit: Payer: Self-pay

## 2019-02-18 DIAGNOSIS — R52 Pain, unspecified: Secondary | ICD-10-CM | POA: Diagnosis present

## 2019-04-15 HISTORY — PX: MICRODISCECTOMY LUMBAR: SUR864

## 2019-04-21 ENCOUNTER — Telehealth: Payer: Self-pay | Admitting: Family Medicine

## 2019-04-21 NOTE — Telephone Encounter (Signed)
Patient called during lunch and left VM asking to have Dr. Synthia Innocent nurse call her back for an unspecified reason.

## 2019-04-21 NOTE — Telephone Encounter (Signed)
Aware, and documented. Thank you for letting me know.

## 2019-04-21 NOTE — Telephone Encounter (Signed)
Patient called stating that she was COVID tested for upcoming back surgery. Just wanted to let up know she was POSITIVE. Stated she is symptom free and is quarantining and is aware of CDC guidelines.

## 2019-06-16 ENCOUNTER — Telehealth: Payer: Self-pay | Admitting: Family Medicine

## 2019-06-16 DIAGNOSIS — L659 Nonscarring hair loss, unspecified: Secondary | ICD-10-CM

## 2019-06-16 NOTE — Telephone Encounter (Signed)
06/16/2019  LVM for pt to call.  Tiajuana Amass, CMA

## 2019-06-16 NOTE — Telephone Encounter (Signed)
Patient left message asking for nurse/ med asst to call her @ [phone]--- No reason given for requested call .  --forwarding to med asst.  -glh

## 2019-06-16 NOTE — Telephone Encounter (Signed)
Pt states that she has had 2 separate microdiscectomies, one in October, 2020 and one in January, 2021.  Pt states that since her last surgery in January she has had A LOT of hair falling out at the roots and is beginning to have patches were there is little hair.  Please advise.  Tiajuana Amass, CMA

## 2019-06-17 NOTE — Telephone Encounter (Signed)
Patient is aware and agrees to referral. Referral ordered. AS, CMA

## 2019-06-17 NOTE — Telephone Encounter (Signed)
Upon quick review of her recent labs, I do not see a medical reason that could be causing this hence, since her symptoms are so bad and she has hair falling out with almost bare patches, I want her to be sent to a hair loss specialist.  We can either send her to the hair loss clinic here in [facility] or it would be the doctor in the dermatology department at [facility] who specializes in hair loss.  Please let patient know there is no additional tests that I can think to run that would eliminate medical reasons for this, however certainly she needs to get in the hands of somebody who who can actually treat her for the hair loss before it gets much worse.

## 2020-01-03 ENCOUNTER — Encounter: Payer: Self-pay | Admitting: Physician Assistant

## 2020-01-03 ENCOUNTER — Ambulatory Visit (INDEPENDENT_AMBULATORY_CARE_PROVIDER_SITE_OTHER): Payer: 59 | Admitting: Physician Assistant

## 2020-01-03 ENCOUNTER — Other Ambulatory Visit: Payer: Self-pay

## 2020-01-03 VITALS — BP 135/84 | HR 74 | Ht 62.0 in | Wt 145.0 lb

## 2020-01-03 DIAGNOSIS — E559 Vitamin D deficiency, unspecified: Secondary | ICD-10-CM | POA: Diagnosis not present

## 2020-01-03 DIAGNOSIS — R5383 Other fatigue: Secondary | ICD-10-CM | POA: Diagnosis not present

## 2020-01-03 DIAGNOSIS — Z0000 Encounter for general adult medical examination without abnormal findings: Secondary | ICD-10-CM | POA: Diagnosis not present

## 2020-01-03 DIAGNOSIS — R030 Elevated blood-pressure reading, without diagnosis of hypertension: Secondary | ICD-10-CM | POA: Diagnosis not present

## 2020-01-03 NOTE — Progress Notes (Signed)
Established Patient Office Visit  Subjective:  Patient ID: Brianna Ball, female    DOB: 06/04/1975  Age: 44 y.o. MRN: [id number]  CC:  Chief Complaint  Patient presents with  . Hypertension    HPI [name] presents for concerns of elevated blood pressure. States that about a year ago precovid she started having issues with sciatica and eventually had to see neurosurgery. Reports since then she has been having issues with elevated blood pressure, which her neurosurgeon attributes to chronic pain. Patient does check BP at home and became concerned when her blood pressure was 173/99 last Thursday. Does report a headache, fatigue, and shortness of breath with exertion. Intermittently she has chest fluttering sensation which she feels is almost like a panic attack. No prior history of hypertension. Does report a strong family history of hypertension.  Past Medical History:  Diagnosis Date  . h/o Adjustment disorder with mixed anxiety and depressed mood 08/18/2007   Qualifier: Diagnosis of  By: Yetta Barre CMA, Chemira    . h/o Cervicalgia 11/02/2015  . History of hysterectomy for benign disease (DUB) 11/01/2000    Past Surgical History:  Procedure Laterality Date  . ABDOMINAL HYSTERECTOMY  2007  . CERVICAL DISC SURGERY  2016  . TUBAL LIGATION  2000    Family History  Problem Relation Age of Onset  . Hypertension Mother     Social History   Socioeconomic History  . Marital status: Married    Spouse name: Not on file  . Number of children: Not on file  . Years of education: Not on file  . Highest education level: Not on file  Occupational History  . Not on file  Tobacco Use  . Smoking status: Never Smoker  . Smokeless tobacco: Never Used  Substance and Sexual Activity  . Alcohol use: No  . Drug use: No  . Sexual activity: Yes  Other Topics Concern  . Not on file  Social History Narrative  . Not on file   Social Determinants of Health   Financial Resource Strain:    . Difficulty of Paying Living Expenses: Not on file  Food Insecurity:   . Worried About Programme researcher, broadcasting/film/video in the Last Year: Not on file  . Ran Out of Food in the Last Year: Not on file  Transportation Needs:   . Lack of Transportation (Medical): Not on file  . Lack of Transportation (Non-Medical): Not on file  Physical Activity:   . Days of Exercise per Week: Not on file  . Minutes of Exercise per Session: Not on file  Stress:   . Feeling of Stress : Not on file  Social Connections:   . Frequency of Communication with Friends and Family: Not on file  . Frequency of Social Gatherings with Friends and Family: Not on file  . Attends Religious Services: Not on file  . Active Member of Clubs or Organizations: Not on file  . Attends Banker Meetings: Not on file  . Marital Status: Not on file  Intimate Partner Violence:   . Fear of Current or Ex-Partner: Not on file  . Emotionally Abused: Not on file  . Physically Abused: Not on file  . Sexually Abused: Not on file    Outpatient Medications Prior to Visit  Medication Sig Dispense Refill  . gabapentin (NEURONTIN) 300 MG capsule Take 1 capsule (300 mg total) by mouth 3 (three) times daily for 5 days, THEN 2 capsules (600 mg total) 3 (three)  times daily for 5 days, THEN 3 capsules (900 mg total) 3 (three) times daily. 765 capsule 0  . lidocaine (LIDODERM) 5 % Place 1 patch onto the skin every 12 (twelve) hours. Remove & Discard patch within 12 hours or as directed by MD 30 patch 0  . Multiple Vitamins-Minerals (MULTIVITAMIN WITH MINERALS) tablet Take 1 tablet by mouth daily.    . predniSONE (DELTASONE) 20 MG tablet Take 3 pills a day for 2 days, 2 pills a day for 2 days, 1 pill a day for 2 days then one half pill a day for 2 days then off 14 tablet 0   No facility-administered medications prior to visit.    Allergies  Allergen Reactions  . Codeine     REACTION: itching    ROS Review of Systems A fourteen system  review of systems was performed and found to be positive as per HPI.  Objective:    Physical Exam General:  Well Developed, appropriate for stated age, in no acute distress but uncomfortable in sitting position due to chronic back pain Neuro:  Alert and oriented,  extra-ocular muscles intact, CNII-XII grossly intact  HEENT:  Normocephalic, atraumatic, neck supple, no JVD Skin:  no gross rash, warm, pink. Cardiac:  RRR, S1 S2 Respiratory:  ECTA B/L and A/P, Not using accessory muscles, speaking in full sentences- unlabored. Vascular:  Ext warm, no cyanosis apprec.; cap RF less 2 sec. Psych:  No HI/SI, judgement and insight good, Euthymic mood. Full Affect.   BP 135/84   Pulse 74   Ht 5\' 2"  (1.575 m)   Wt 145 lb (65.8 kg)   SpO2 98%   BMI 26.52 kg/m  Wt Readings from Last 3 Encounters:  01/03/20 145 lb (65.8 kg)  02/03/19 139 lb 1.6 oz (63.1 kg)  01/25/19 135 lb (61.2 kg)     Health Maintenance Due  Topic Date Due  . Hepatitis C Screening  Never done  . HIV Screening  Never done  . PAP SMEAR-Modifier  08/30/2019  . INFLUENZA VACCINE  Never done    There are no preventive care reminders to display for this patient.  Lab Results  Component Value Date   TSH 3.730 12/23/2018   Lab Results  Component Value Date   WBC 6.6 12/23/2018   HGB 12.9 12/23/2018   HCT 38.6 12/23/2018   MCV 89 12/23/2018   PLT 261 12/23/2018   Lab Results  Component Value Date   NA 142 12/23/2018   K 4.4 12/23/2018   CO2 22 12/23/2018   GLUCOSE 88 12/23/2018   BUN 10 12/23/2018   CREATININE 0.91 12/23/2018   BILITOT 0.4 12/23/2018   ALKPHOS 57 12/23/2018   AST 12 12/23/2018   ALT 9 12/23/2018   PROT 6.5 12/23/2018   ALBUMIN 4.3 12/23/2018   CALCIUM 9.2 12/23/2018   ANIONGAP 5 (L) 07/08/2014   Lab Results  Component Value Date   CHOL 215 (H) 12/23/2018   Lab Results  Component Value Date   HDL 62 12/23/2018   Lab Results  Component Value Date   LDLCALC 141 (H) 12/23/2018    Lab Results  Component Value Date   TRIG 70 12/23/2018   Lab Results  Component Value Date   CHOLHDL 3.5 12/23/2018   Lab Results  Component Value Date   HGBA1C 4.9 12/23/2018      Assessment & Plan:   Problem List Items Addressed This Visit      Other   Vitamin D insufficiency (  Chronic)   Relevant Orders   VITAMIN D 25 Hydroxy (Vit-D Deficiency, Fractures)    Other Visit Diagnoses    Elevated blood pressure reading in office without diagnosis of hypertension    -  Primary   Relevant Orders   CBC   Comprehensive metabolic panel   Hemoglobin A1c   Direct LDL   TSH   VITAMIN D 25 Hydroxy (Vit-D Deficiency, Fractures)   Healthcare maintenance       Relevant Orders   CBC   Comprehensive metabolic panel   Hemoglobin A1c   Direct LDL   TSH   Fatigue, unspecified type       Relevant Orders   CBC   Comprehensive metabolic panel   Hemoglobin A1c   Direct LDL   TSH   VITAMIN D 25 Hydroxy (Vit-D Deficiency, Fractures)     Elevated blood pressure in office without diagnosis of hypertension, Fatigue: -Discussed with patient chronic pain possibly contributing to elevated blood pressure. Will place lab orders to evaluate for other possible etiologies such as liver or renal disease, diabetes mellitus, or thyroid disease. Advised to return for nurse visit this week for BP recheck. -BP initially elevated at 152/73 and improved upon recheck (135/84). -Pending lab results and BP recheck will start antihypertensive treatment if indicated. -Discussed if symptoms of palpitations and dyspnea with exertion worsen or fail to improve recommend further evaluation. Pt verbalized understanding.  No orders of the defined types were placed in this encounter.   Follow-up: Return for Thursday for nurse visit BP check, Follow up in 2 weeks for BP.    Mayer Masker, PA-C

## 2020-01-04 LAB — VITAMIN D 25 HYDROXY (VIT D DEFICIENCY, FRACTURES): Vit D, 25-Hydroxy: 30.8 ng/mL (ref 30.0–100.0)

## 2020-01-04 LAB — CBC
Hematocrit: 41 % (ref 34.0–46.6)
Hemoglobin: 13.7 g/dL (ref 11.1–15.9)
MCH: 29 pg (ref 26.6–33.0)
MCHC: 33.4 g/dL (ref 31.5–35.7)
MCV: 87 fL (ref 79–97)
Platelets: 334 10*3/uL (ref 150–450)
RBC: 4.73 x10E6/uL (ref 3.77–5.28)
RDW: 12.3 % (ref 11.7–15.4)
WBC: 6.5 10*3/uL (ref 3.4–10.8)

## 2020-01-04 LAB — HEMOGLOBIN A1C
Est. average glucose Bld gHb Est-mCnc: 97 mg/dL
Hgb A1c MFr Bld: 5 % (ref 4.8–5.6)

## 2020-01-04 LAB — COMPREHENSIVE METABOLIC PANEL
ALT: 12 IU/L (ref 0–32)
AST: 15 IU/L (ref 0–40)
Albumin/Globulin Ratio: 2.1 (ref 1.2–2.2)
Albumin: 4.7 g/dL (ref 3.8–4.8)
Alkaline Phosphatase: 70 IU/L (ref 44–121)
BUN/Creatinine Ratio: 12 (ref 9–23)
BUN: 9 mg/dL (ref 6–24)
Bilirubin Total: 0.3 mg/dL (ref 0.0–1.2)
CO2: 24 mmol/L (ref 20–29)
Calcium: 9.9 mg/dL (ref 8.7–10.2)
Chloride: 103 mmol/L (ref 96–106)
Creatinine, Ser: 0.75 mg/dL (ref 0.57–1.00)
GFR calc Af Amer: 111 mL/min/{1.73_m2} (ref >59)
GFR calc non Af Amer: 96 mL/min/{1.73_m2} (ref >59)
Globulin, Total: 2.2 g/dL (ref 1.5–4.5)
Glucose: 88 mg/dL (ref 65–99)
Potassium: 4.7 mmol/L (ref 3.5–5.2)
Sodium: 138 mmol/L (ref 134–144)
Total Protein: 6.9 g/dL (ref 6.0–8.5)

## 2020-01-04 LAB — TSH: TSH: 2.63 u[IU]/mL (ref 0.450–4.500)

## 2020-01-04 LAB — LDL CHOLESTEROL, DIRECT: LDL Direct: 167 mg/dL — ABNORMAL HIGH (ref 0–99)

## 2020-01-05 ENCOUNTER — Ambulatory Visit: Payer: 59 | Admitting: Physician Assistant

## 2020-01-05 ENCOUNTER — Other Ambulatory Visit: Payer: Self-pay

## 2020-01-11 ENCOUNTER — Other Ambulatory Visit: Payer: Self-pay | Admitting: Neurological Surgery

## 2020-01-12 NOTE — Progress Notes (Unsigned)
Error

## 2020-01-16 ENCOUNTER — Other Ambulatory Visit: Payer: Self-pay

## 2020-01-17 ENCOUNTER — Other Ambulatory Visit: Payer: Self-pay

## 2020-01-24 ENCOUNTER — Ambulatory Visit: Payer: 59 | Admitting: Physician Assistant

## 2020-01-31 ENCOUNTER — Other Ambulatory Visit: Payer: Self-pay

## 2020-01-31 ENCOUNTER — Ambulatory Visit: Payer: 59 | Admitting: Vascular Surgery

## 2020-01-31 ENCOUNTER — Encounter: Payer: Self-pay | Admitting: Vascular Surgery

## 2020-01-31 DIAGNOSIS — M5441 Lumbago with sciatica, right side: Secondary | ICD-10-CM | POA: Diagnosis not present

## 2020-01-31 DIAGNOSIS — G8929 Other chronic pain: Secondary | ICD-10-CM

## 2020-01-31 NOTE — Progress Notes (Signed)
[facility] - [place], [place] - [address] Marye Round [place] [address] Phone: [phone] Fax: [phone]      Your procedure is scheduled on October 25  Report to [facility] Main Entrance "A" at 1000 A.M., and check in at the Admitting office.  Call this number if you have problems the morning of surgery:  [phone]  Call [phone] if you have any questions prior to your surgery date Monday-Friday 8am-4pm    Remember:  Do not eat or drink after midnight the night before your surgery    There are NO medications that you need to take the morning of surgery   As of today, STOP taking any Aspirin (unless otherwise instructed by your surgeon) Aleve, Naproxen, Ibuprofen, Motrin, Advil, Goody's, BC's, all herbal medications, fish oil, and all vitamins.                      Do not wear jewelry, make up, or nail polish            Do not wear lotions, powders, perfumes, or deodorant.            Do not shave 48 hours prior to surgery.              Do not bring valuables to the hospital.            [facility] is not responsible for any belongings or valuables.  Do NOT Smoke (Tobacco/Vaping) or drink Alcohol 24 hours prior to your procedure If you use a CPAP at night, you may bring all equipment for your overnight stay.   Contacts, glasses, dentures or bridgework may not be worn into surgery.      For patients admitted to the hospital, discharge time will be determined by your treatment team.   Patients discharged the day of surgery will not be allowed to drive home, and someone needs to stay with them for 24 hours.    Special instructions:   [facility]- Preparing For Surgery  Before surgery, you can play an important role. Because skin is not sterile, your skin needs to be as free of germs as possible. You can reduce the number of germs on your skin by washing with CHG (chlorahexidine gluconate) Soap before surgery.  CHG is an antiseptic  cleaner which kills germs and bonds with the skin to continue killing germs even after washing.    Oral Hygiene is also important to reduce your risk of infection.  Remember - BRUSH YOUR TEETH THE MORNING OF SURGERY WITH YOUR REGULAR TOOTHPASTE  Please do not use if you have an allergy to CHG or antibacterial soaps. If your skin becomes reddened/irritated stop using the CHG.  Do not shave (including legs and underarms) for at least 48 hours prior to first CHG shower. It is OK to shave your face.  Please follow these instructions carefully.   1. Shower the NIGHT BEFORE SURGERY and the MORNING OF SURGERY with CHG Soap.   2. If you chose to wash your hair, wash your hair first as usual with your normal shampoo.  3. After you shampoo, rinse your hair and body thoroughly to remove the shampoo.  4. Use CHG as you would any other liquid soap. You can apply CHG directly to the skin and wash gently with a scrungie or a clean washcloth.   5. Apply the CHG Soap to your body ONLY FROM THE NECK DOWN.  Do  not use on open wounds or open sores. Avoid contact with your eyes, ears, mouth and genitals (private parts). Wash Face and genitals (private parts)  with your normal soap.   6. Wash thoroughly, paying special attention to the area where your surgery will be performed.  7. Thoroughly rinse your body with warm water from the neck down.  8. DO NOT shower/wash with your normal soap after using and rinsing off the CHG Soap.  9. Pat yourself dry with a CLEAN TOWEL.  10. Wear CLEAN PAJAMAS to bed the night before surgery  11. Place CLEAN SHEETS on your bed the night of your first shower and DO NOT SLEEP WITH PETS.   Day of Surgery: Wear Clean/Comfortable clothing the morning of surgery Do not apply any deodorants/lotions.   Remember to brush your teeth WITH YOUR REGULAR TOOTHPASTE.   Please read over the following fact sheets that you were given.

## 2020-01-31 NOTE — Progress Notes (Signed)
Patient name: Brianna Ball MRN: [id number] DOB: 17-Dec-1975 Sex: female  REASON FOR CONSULT: Evaluate for L5-S1 ALIF  HPI: Brianna Ball is a 44 y.o. female, with chronic lower back pain and right leg radiculopathy who presents for preop evaluation of scheduled L5-S1 ALIF.  Patient has been under the care of Dr. Yetta Barre with neurosurgery and reports since Covid she started having lower back pain with radiculopathy.  She has had several back surgeries from a posterior approach but no anterior approach.  Previous abdominal surgery includes C-section and hysterectomy.  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  Past Medical History:  Diagnosis Date  . Back pain   . h/o Adjustment disorder with mixed anxiety and depressed mood 08/18/2007   Qualifier: Diagnosis of  By: Yetta Barre CMA, Chemira    . h/o Cervicalgia 11/02/2015  . History of hysterectomy for benign disease (DUB) 11/01/2000  . Leg numbness   . Postlaminectomy syndrome, lumbar   . Right leg pain     Past Surgical History:  Procedure Laterality Date  . ABDOMINAL HYSTERECTOMY  2007  . CERVICAL DISC SURGERY  2016  . TUBAL LIGATION  2000    Family History  Problem Relation Age of Onset  . Hypertension Mother     SOCIAL HISTORY: Social History   Socioeconomic History  . Marital status: Married    Spouse name: Not on file  . Number of children: Not on file  . Years of education: Not on file  . Highest education level: Not on file  Occupational History  . Not on file  Tobacco Use  . Smoking status: Never Smoker  . Smokeless tobacco: Never Used  Substance and Sexual Activity  . Alcohol use: No  . Drug use: No  . Sexual activity: Yes  Other Topics Concern  . Not on file  Social History Narrative  . Not on file   Social Determinants of Health   Financial Resource Strain:   . Difficulty of Paying Living Expenses: Not on file  Food Insecurity:   . Worried About Programme researcher, broadcasting/film/video in the Last Year: Not on file  . Ran Out of  Food in the Last Year: Not on file  Transportation Needs:   . Lack of Transportation (Medical): Not on file  . Lack of Transportation (Non-Medical): Not on file  Physical Activity:   . Days of Exercise per Week: Not on file  . Minutes of Exercise per Session: Not on file  Stress:   . Feeling of Stress : Not on file  Social Connections:   . Frequency of Communication with Friends and Family: Not on file  . Frequency of Social Gatherings with Friends and Family: Not on file  . Attends Religious Services: Not on file  . Active Member of Clubs or Organizations: Not on file  . Attends Banker Meetings: Not on file  . Marital Status: Not on file  Intimate Partner Violence:   . Fear of Current or Ex-Partner: Not on file  . Emotionally Abused: Not on file  . Physically Abused: Not on file  . Sexually Abused: Not on file    Allergies  Allergen Reactions  . Codeine Itching    Current Outpatient Medications  Medication Sig Dispense Refill  . amitriptyline (ELAVIL) 50 MG tablet Take 50 mg by mouth at bedtime. (Patient not taking: Reported on 01/24/2020)    . diazepam (VALIUM) 5 MG tablet SMARTSIG:1 Tablet(s) By Mouth (Patient not taking: Reported  on 01/24/2020)    . ibuprofen (ADVIL) 200 MG tablet Take 800 mg by mouth every 8 (eight) hours as needed for moderate pain.    . naproxen sodium (ANAPROX) 550 MG tablet SMARTSIG:1 Tablet(s) By Mouth Every 12 Hours PRN (Patient not taking: Reported on 01/24/2020)     No current facility-administered medications for this visit.    REVIEW OF SYSTEMS:  [X]  denotes positive finding, [ ]  denotes negative finding Cardiac  Comments:  Chest pain or chest pressure:    Shortness of breath upon exertion:    Short of breath when lying flat:    Irregular heart rhythm:        Vascular    Pain in calf, thigh, or hip brought on by ambulation:    Pain in feet at night that wakes you up from your sleep:     Blood clot in your veins:    Leg  swelling:         Pulmonary    Oxygen at home:    Productive cough:     Wheezing:         Neurologic    Sudden weakness in arms or legs:  x Right  Sudden numbness in arms or legs:  x Right  Sudden onset of difficulty speaking or slurred speech:    Temporary loss of vision in one eye:     Problems with dizziness:         Gastrointestinal    Blood in stool:     Vomited blood:         Genitourinary    Burning when urinating:     Blood in urine:        Psychiatric    Major depression:         Hematologic    Bleeding problems:    Problems with blood clotting too easily:        Skin    Rashes or ulcers:        Constitutional    Fever or chills:      PHYSICAL EXAM: Vitals:   01/31/20 1035  BP: (!) 153/100  Pulse: 74  Resp: 16  Temp: 98.4 F (36.9 C)  TempSrc: Temporal  SpO2: 97%  Weight: 142 lb (64.4 kg)  Height: 5\' 1"  (1.549 m)    GENERAL: The patient is a well-nourished female, in no acute distress. The vital signs are documented above. CARDIAC: There is a regular rate and rhythm.  VASCULAR:  Palpable DP PT pulses bilaterally Feet arm warm and well perfused PULMONARY: No respiratory distress. ABDOMEN: Soft and non-tender.  Previous lower pfannestiel incision.  Umbilical incision. MUSCULOSKELETAL: There are no major deformities or cyanosis. NEUROLOGIC: No focal weakness or paresthesias are detected. SKIN: There are no ulcers or rashes noted. PSYCHIATRIC: The patient has a normal affect.  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  Assessment/Plan:  44 year old female with chronic lower back pain and right leg radiculopathy who presents for preop evaluation of L5-S1 ALIF.  I discussed plans for a transverse incision over the left rectus muscle with mobilization of the left rectus as well as peritoneum/intestines and left ureter across midline and mobilization of the left iliac artery and  vein for exposure of the disc space from anterior approach.  We discussed risk and benefits including injury to the above structures.  I have reviewed her most recent imaging including MR and I think she would  be an excellent candidate for anterior approach.  Look forward to assisting Dr. Yetta Barre on Monday.   Cephus Shelling, MD [facility] Office: [phone]

## 2020-02-01 ENCOUNTER — Other Ambulatory Visit: Payer: Self-pay

## 2020-02-01 ENCOUNTER — Encounter (HOSPITAL_COMMUNITY): Payer: Self-pay

## 2020-02-01 ENCOUNTER — Encounter (HOSPITAL_COMMUNITY)
Admission: RE | Admit: 2020-02-01 | Discharge: 2020-02-01 | Disposition: A | Discharge status: Home / Self Care | Payer: 59 | Source: Ambulatory Visit | Attending: Neurological Surgery | Admitting: Neurological Surgery

## 2020-02-01 DIAGNOSIS — Z01818 Encounter for other preprocedural examination: Secondary | ICD-10-CM | POA: Diagnosis not present

## 2020-02-01 HISTORY — DX: Personal history of other diseases of the circulatory system: Z86.79

## 2020-02-01 HISTORY — DX: Other specified postprocedural states: Z98.890

## 2020-02-01 HISTORY — DX: Other specified postprocedural states: R11.2

## 2020-02-01 HISTORY — DX: Anemia, unspecified: D64.9

## 2020-02-01 LAB — SURGICAL PCR SCREEN
MRSA, PCR: NEGATIVE
Staphylococcus aureus: POSITIVE — AB

## 2020-02-01 LAB — CBC
HCT: 40.8 % (ref 36.0–46.0)
Hemoglobin: 13.2 g/dL (ref 12.0–15.0)
MCH: 29.1 pg (ref 26.0–34.0)
MCHC: 32.4 g/dL (ref 30.0–36.0)
MCV: 89.9 fL (ref 80.0–100.0)
Platelets: 343 10*3/uL (ref 150–400)
RBC: 4.54 MIL/uL (ref 3.87–5.11)
RDW: 12.1 % (ref 11.5–15.5)
WBC: 7.7 10*3/uL (ref 4.0–10.5)
nRBC: 0 % (ref 0.0–0.2)

## 2020-02-01 LAB — BASIC METABOLIC PANEL
Anion gap: 9 (ref 5–15)
BUN: 8 mg/dL (ref 6–20)
CO2: 25 mmol/L (ref 22–32)
Calcium: 9.3 mg/dL (ref 8.9–10.3)
Chloride: 105 mmol/L (ref 98–111)
Creatinine, Ser: 0.8 mg/dL (ref 0.44–1.00)
GFR, Estimated: >60 mL/min (ref >60)
Glucose, Bld: 88 mg/dL (ref 70–99)
Potassium: 4.1 mmol/L (ref 3.5–5.1)
Sodium: 139 mmol/L (ref 135–145)

## 2020-02-01 LAB — TYPE AND SCREEN
ABO/RH(D): A NEG
Antibody Screen: NEGATIVE

## 2020-02-01 NOTE — Progress Notes (Signed)
Anesthesia Note  Case: [id number] Date/Time: 02/06/20 1145   Procedures:      Lumbar 5-Sacral 1 Anterior lumbar interbody fusion (N/A ) - 3C/RM 19     ABDOMINAL EXPOSURE (N/A )   Anesthesia type: General   Pre-op diagnosis: Post laminectomy syndrome   Location: [facility] OR ROOM 19 / [facility] OR   Surgeons: Tia Alert, MD; Cephus Shelling, MD      DISCUSSION: Patient is a 44 year old female scheduled for the above procedure.  History includes never smoker, post-operative N/V, back pain, anemia, hysterectomy (12/29/07), microdiscectomy (2020, 2021), cervical disc surgery (2016), adjustment disorder with mixed anxiety/depressed mood, congenital murmur.  Patient evaluated at her PAT visit due to history of congenital heart murmur. She reported diagnosis of murmur as an infant and believes she was told murmur was due to aortic stenosis that was followed by a pediatric cardiologist in [place]. She does not recall ever being told she had a bicuspid AV. She said she initially was given antibiotics for dental visits, but prior to the age of 44 years old she "outgrew" her murmur and was reportedly discharged from cardiology and was told she did not require on-going follow-up or dental prophylaxis. 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 She has had back issues for a year now but was walking 7 miles prior to that. She is still able to ADLs and still considers herself active but pain does pose some limitations. She denied chest pain, SOB, syncope, palpitations. She reported some mild RLE edema with her RLE radicular pain.   Patient denied CV symptoms. No syncope/presyncope. She has tolerated lumbar microdiscectomy earlier this year and also in 2020. She denied anesthesia complications other than history of post-operative N/V. Discussed it may be beneficial in the future to obtain her old records to further clarify murmur history.  However, no murmur noted on exam by myself and anesthesiologist Val Eagle, MD. EKG showed sinus bradycardia and poor R wave progression. If no acute changes then it is anticipated that she can proceed as planned.  Presurgical COVID-19 test is scheduled for 02/03/2020.   VS: BP (!) 149/78   Pulse 63   Temp 36.6 C (Temporal)   Resp 18   Ht 5\' 1"  (1.549 m)   Wt 65.9 kg   SpO2 100%   BMI 27.44 kg/m  Provider wore N95, universal face mask, face shield. Patient wore universal face mask. Heart RRR. No murmur noted. Lungs clear anteriorly.    PROVIDERS: , PA-C is PCP    LABS: Labs reviewed: Acceptable for surgery. (all labs ordered are listed, but only abnormal results are displayed)  Labs Reviewed  SURGICAL PCR SCREEN  CBC  BASIC METABOLIC PANEL  TYPE AND SCREEN    EKG: Sinus bradycardia at 57 bpm Septal infarct , age undetermined Abnormal ECG   CV: Her last echocardiogram was in childhood (elementary school), so results not readily available.    Past Medical History:  Diagnosis Date  . Anemia   . Back pain   . h/o Adjustment disorder with mixed anxiety and depressed mood 08/18/2007   Qualifier: Diagnosis of  By: 10/18/2007 CMA, Chemira    . h/o Cervicalgia 11/02/2015  . History of cardiac murmur in childhood   . History of hysterectomy for benign disease (DUB) 11/01/2000  . Leg numbness   . PONV (postoperative nausea and vomiting)   . Postlaminectomy syndrome, lumbar   .  Right leg pain     Past Surgical History:  Procedure Laterality Date  . ABDOMINAL HYSTERECTOMY  2007  . CERVICAL DISC SURGERY  2016  . MICRODISCECTOMY LUMBAR  2020  . MICRODISCECTOMY LUMBAR  2021  . TUBAL LIGATION  2000    MEDICATIONS: . amitriptyline (ELAVIL) 50 MG tablet  . diazepam (VALIUM) 5 MG tablet  . ibuprofen (ADVIL) 200 MG tablet  . naproxen sodium (ANAPROX) 550 MG tablet   No current facility-administered medications for this encounter.    Shonna Chock,  PA-C Surgical Short Stay/Anesthesiology [facility] Phone [phone] [facility] Phone [phone] 02/01/2020 12:46 PM

## 2020-02-01 NOTE — Anesthesia Preprocedure Evaluation (Addendum)
Anesthesia Evaluation    History of Anesthesia Complications (+) PONV  Airway Mallampati: II  TM Distance: >3 FB Neck ROM: Full    Dental no notable dental hx.    Pulmonary    Pulmonary exam normal breath sounds clear to auscultation       Cardiovascular Normal cardiovascular exam Rhythm:Regular Rate:Normal     Neuro/Psych    GI/Hepatic   Endo/Other    Renal/GU      Musculoskeletal   Abdominal   Peds  Hematology   Anesthesia Other Findings   Reproductive/Obstetrics                             Anesthesia Physical Anesthesia Plan  ASA: II  Anesthesia Plan: General   Post-op Pain Management:    Induction: Intravenous  PONV Risk Score and Plan: 4 or greater and Ondansetron, Dexamethasone, Droperidol, Midazolam, Scopolamine patch - Pre-op and Treatment may vary due to age or medical condition  Airway Management Planned: Oral ETT  Additional Equipment:   Intra-op Plan:   Post-operative Plan: Extubation in OR  Informed Consent: I have reviewed the patients History and Physical, chart, labs and discussed the procedure including the risks, benefits and alternatives for the proposed anesthesia with the patient or authorized representative who has indicated his/her understanding and acceptance.     Dental advisory given  Plan Discussed with: CRNA and Surgeon  Anesthesia Plan Comments: (PAT note written 02/01/2020 by Shonna Chock, PA-C. )       Anesthesia Quick Evaluation

## 2020-02-01 NOTE — Progress Notes (Signed)
PCP - Mayer Masker, PA-C Cardiologist - denies  PPM/ICD - denies  Chest x-ray - N/A EKG - 02/01/2020 Stress Test - denies ECHO - per patient as a child Cardiac Cath - denies  Sleep Study - denies CPAP - N/A  DM: denies  Blood Thinner Instructions: N/A Aspirin Instructions: N/A  ERAS Protcol - No  COVID TEST- Scheduled for 02/03/2020. Patient verbalized understanding of self-quarantine instructions, appointment time and place.  Anesthesia review: YES, abnormal EKG Patient stated had aortic stenosis as an infant and was followed-up through middle school, no cardiac testing since childhood per patient, she's been fine and no problems with her heart. Brianna Standard, PA to see patient.  Patient denies shortness of breath, fever, cough and chest pain at PAT appointment  All instructions explained to the patient, with a verbal understanding of the material. Patient agrees to go over the instructions while at home for a better understanding. Patient also instructed to self quarantine after being tested for COVID-19. The opportunity to ask questions was provided.

## 2020-02-03 ENCOUNTER — Other Ambulatory Visit (HOSPITAL_COMMUNITY)
Admission: RE | Admit: 2020-02-03 | Discharge: 2020-02-03 | Disposition: A | Discharge status: Home / Self Care | Payer: 59 | Source: Ambulatory Visit | Attending: Neurological Surgery | Admitting: Neurological Surgery

## 2020-02-03 DIAGNOSIS — Z01812 Encounter for preprocedural laboratory examination: Secondary | ICD-10-CM | POA: Insufficient documentation

## 2020-02-03 DIAGNOSIS — Z20822 Contact with and (suspected) exposure to covid-19: Secondary | ICD-10-CM | POA: Insufficient documentation

## 2020-02-03 LAB — SARS CORONAVIRUS 2 (TAT 6-24 HRS): SARS Coronavirus 2: NEGATIVE

## 2020-02-06 ENCOUNTER — Inpatient Hospital Stay (HOSPITAL_COMMUNITY): Payer: 59 | Admitting: Anesthesiology

## 2020-02-06 ENCOUNTER — Encounter (HOSPITAL_COMMUNITY): Payer: Self-pay | Admitting: Neurological Surgery

## 2020-02-06 ENCOUNTER — Encounter (HOSPITAL_COMMUNITY)
Admission: RE | Disposition: A | Discharge status: Home / Self Care | Payer: Self-pay | Source: Home / Self Care | Attending: Neurological Surgery

## 2020-02-06 ENCOUNTER — Inpatient Hospital Stay (HOSPITAL_COMMUNITY)
Admission: RE | Admit: 2020-02-06 | Discharge: 2020-02-07 | DRG: 460 | Disposition: A | Discharge status: Home / Self Care | Payer: 59 | Attending: Neurological Surgery | Admitting: Neurological Surgery

## 2020-02-06 ENCOUNTER — Inpatient Hospital Stay (HOSPITAL_COMMUNITY): Payer: 59

## 2020-02-06 ENCOUNTER — Inpatient Hospital Stay (HOSPITAL_COMMUNITY): Payer: 59 | Admitting: Vascular Surgery

## 2020-02-06 ENCOUNTER — Other Ambulatory Visit: Payer: Self-pay

## 2020-02-06 DIAGNOSIS — Z8616 Personal history of COVID-19: Secondary | ICD-10-CM | POA: Diagnosis not present

## 2020-02-06 DIAGNOSIS — Z90710 Acquired absence of both cervix and uterus: Secondary | ICD-10-CM | POA: Diagnosis not present

## 2020-02-06 DIAGNOSIS — M961 Postlaminectomy syndrome, not elsewhere classified: Secondary | ICD-10-CM | POA: Diagnosis present

## 2020-02-06 DIAGNOSIS — M5416 Radiculopathy, lumbar region: Secondary | ICD-10-CM

## 2020-02-06 DIAGNOSIS — M5417 Radiculopathy, lumbosacral region: Secondary | ICD-10-CM | POA: Diagnosis present

## 2020-02-06 DIAGNOSIS — Z8249 Family history of ischemic heart disease and other diseases of the circulatory system: Secondary | ICD-10-CM

## 2020-02-06 DIAGNOSIS — Z885 Allergy status to narcotic agent status: Secondary | ICD-10-CM

## 2020-02-06 DIAGNOSIS — Z20822 Contact with and (suspected) exposure to covid-19: Secondary | ICD-10-CM | POA: Diagnosis present

## 2020-02-06 DIAGNOSIS — M5137 Other intervertebral disc degeneration, lumbosacral region: Secondary | ICD-10-CM | POA: Diagnosis present

## 2020-02-06 DIAGNOSIS — Z419 Encounter for procedure for purposes other than remedying health state, unspecified: Secondary | ICD-10-CM

## 2020-02-06 DIAGNOSIS — Z981 Arthrodesis status: Secondary | ICD-10-CM

## 2020-02-06 HISTORY — PX: ABDOMINAL EXPOSURE: SHX5708

## 2020-02-06 HISTORY — PX: ANTERIOR LUMBAR FUSION: SHX1170

## 2020-02-06 LAB — ABO/RH: ABO/RH(D): A NEG

## 2020-02-06 SURGERY — ANTERIOR LUMBAR FUSION 1 LEVEL
Anesthesia: General | Site: Spine Lumbar

## 2020-02-06 MED ORDER — FENTANYL CITRATE (PF) 250 MCG/5ML IJ SOLN
INTRAMUSCULAR | Status: AC
  Filled 2020-02-06: qty 5

## 2020-02-06 MED ORDER — SODIUM CHLORIDE 0.9% FLUSH
3.0000 mL | Freq: Two times a day (BID) | INTRAVENOUS | Status: DC
  Administered 2020-02-06: 3 mL via INTRAVENOUS

## 2020-02-06 MED ORDER — ACETAMINOPHEN 325 MG PO TABS
650.0000 mg | ORAL_TABLET | ORAL | Status: DC | PRN
  Administered 2020-02-06 – 2020-02-07 (×2): 650 mg via ORAL
  Filled 2020-02-06 (×2): qty 2

## 2020-02-06 MED ORDER — ONDANSETRON HCL 4 MG PO TABS: 4.0000 mg | ORAL_TABLET | Freq: Four times a day (QID) | ORAL | Status: DC | PRN

## 2020-02-06 MED ORDER — METHOCARBAMOL 500 MG PO TABS
500.0000 mg | ORAL_TABLET | Freq: Four times a day (QID) | ORAL | Status: DC | PRN
  Administered 2020-02-06 – 2020-02-07 (×3): 500 mg via ORAL
  Filled 2020-02-06 (×3): qty 1

## 2020-02-06 MED ORDER — FENTANYL CITRATE (PF) 250 MCG/5ML IJ SOLN
INTRAMUSCULAR | Status: DC | PRN
  Administered 2020-02-06: 100 ug via INTRAVENOUS
  Administered 2020-02-06 (×3): 50 ug via INTRAVENOUS

## 2020-02-06 MED ORDER — MUPIROCIN 2 % EX OINT
1.0000 "application " | TOPICAL_OINTMENT | Freq: Two times a day (BID) | CUTANEOUS | Status: DC
  Administered 2020-02-06: 1 via NASAL
  Filled 2020-02-06: qty 22

## 2020-02-06 MED ORDER — SCOPOLAMINE 1 MG/3DAYS TD PT72
MEDICATED_PATCH | TRANSDERMAL | Status: AC
  Administered 2020-02-06: 1.5 mg
  Filled 2020-02-06: qty 1

## 2020-02-06 MED ORDER — POTASSIUM CHLORIDE IN NACL 20-0.9 MEQ/L-% IV SOLN: INTRAVENOUS | Status: DC

## 2020-02-06 MED ORDER — ROCURONIUM BROMIDE 10 MG/ML (PF) SYRINGE
PREFILLED_SYRINGE | INTRAVENOUS | Status: AC
  Filled 2020-02-06: qty 10

## 2020-02-06 MED ORDER — ORAL CARE MOUTH RINSE: 15.0000 mL | Freq: Once | OROMUCOSAL | Status: AC

## 2020-02-06 MED ORDER — PROMETHAZINE HCL 25 MG/ML IJ SOLN: 6.2500 mg | INTRAMUSCULAR | Status: DC | PRN

## 2020-02-06 MED ORDER — HYDROMORPHONE HCL 1 MG/ML IJ SOLN
0.2500 mg | INTRAMUSCULAR | Status: DC | PRN
  Administered 2020-02-06 (×2): 0.25 mg via INTRAVENOUS
  Administered 2020-02-06: 0.5 mg via INTRAVENOUS

## 2020-02-06 MED ORDER — SENNA 8.6 MG PO TABS
1.0000 | ORAL_TABLET | Freq: Two times a day (BID) | ORAL | Status: DC
  Administered 2020-02-06 (×2): 8.6 mg via ORAL
  Filled 2020-02-06 (×2): qty 1

## 2020-02-06 MED ORDER — CHLORHEXIDINE GLUCONATE 4 % EX LIQD: 60.0000 mL | Freq: Once | CUTANEOUS | Status: DC

## 2020-02-06 MED ORDER — SUGAMMADEX SODIUM 200 MG/2ML IV SOLN
INTRAVENOUS | Status: DC | PRN
  Administered 2020-02-06 (×2): 100 mg via INTRAVENOUS

## 2020-02-06 MED ORDER — METHOCARBAMOL 1000 MG/10ML IJ SOLN: 500.0000 mg | Freq: Four times a day (QID) | INTRAVENOUS | Status: DC | PRN

## 2020-02-06 MED ORDER — CEFAZOLIN SODIUM-DEXTROSE 2-4 GM/100ML-% IV SOLN
INTRAVENOUS | Status: AC
  Filled 2020-02-06: qty 100

## 2020-02-06 MED ORDER — DIPHENHYDRAMINE HCL 50 MG/ML IJ SOLN
INTRAMUSCULAR | Status: DC | PRN
  Administered 2020-02-06: 12.5 mg via INTRAVENOUS

## 2020-02-06 MED ORDER — CELECOXIB 200 MG PO CAPS
200.0000 mg | ORAL_CAPSULE | Freq: Two times a day (BID) | ORAL | Status: DC
  Administered 2020-02-06: 200 mg via ORAL
  Filled 2020-02-06 (×2): qty 1

## 2020-02-06 MED ORDER — 0.9 % SODIUM CHLORIDE (POUR BTL) OPTIME
TOPICAL | Status: DC | PRN
  Administered 2020-02-06: 1000 mL

## 2020-02-06 MED ORDER — ONDANSETRON HCL 4 MG/2ML IJ SOLN
INTRAMUSCULAR | Status: AC
  Filled 2020-02-06: qty 2

## 2020-02-06 MED ORDER — ONDANSETRON HCL 4 MG/2ML IJ SOLN: 4.0000 mg | Freq: Four times a day (QID) | INTRAMUSCULAR | Status: DC | PRN

## 2020-02-06 MED ORDER — THROMBIN 5000 UNITS EX SOLR: OROMUCOSAL | Status: DC | PRN

## 2020-02-06 MED ORDER — DIPHENHYDRAMINE HCL 50 MG/ML IJ SOLN
INTRAMUSCULAR | Status: AC
  Filled 2020-02-06: qty 1

## 2020-02-06 MED ORDER — DEXAMETHASONE SODIUM PHOSPHATE 10 MG/ML IJ SOLN
INTRAMUSCULAR | Status: DC | PRN
  Administered 2020-02-06: 10 mg via INTRAVENOUS

## 2020-02-06 MED ORDER — HYDROMORPHONE HCL 1 MG/ML IJ SOLN
INTRAMUSCULAR | Status: AC
  Filled 2020-02-06: qty 1

## 2020-02-06 MED ORDER — THROMBIN 20000 UNITS EX SOLR
CUTANEOUS | Status: AC
  Filled 2020-02-06: qty 20000

## 2020-02-06 MED ORDER — MIDAZOLAM HCL 2 MG/2ML IJ SOLN
INTRAMUSCULAR | Status: AC
  Filled 2020-02-06: qty 2

## 2020-02-06 MED ORDER — LIDOCAINE 2% (20 MG/ML) 5 ML SYRINGE
INTRAMUSCULAR | Status: AC
  Filled 2020-02-06: qty 5

## 2020-02-06 MED ORDER — PROPOFOL 10 MG/ML IV BOLUS
INTRAVENOUS | Status: DC | PRN
  Administered 2020-02-06: 120 mg via INTRAVENOUS

## 2020-02-06 MED ORDER — PROPOFOL 500 MG/50ML IV EMUL
INTRAVENOUS | Status: DC | PRN
  Administered 2020-02-06: 30 ug/kg/min via INTRAVENOUS

## 2020-02-06 MED ORDER — SODIUM CHLORIDE 0.9 % IV SOLN: 250.0000 mL | INTRAVENOUS | Status: DC

## 2020-02-06 MED ORDER — ROCURONIUM BROMIDE 10 MG/ML (PF) SYRINGE
PREFILLED_SYRINGE | INTRAVENOUS | Status: DC | PRN
  Administered 2020-02-06: 70 mg via INTRAVENOUS

## 2020-02-06 MED ORDER — MORPHINE SULFATE (PF) 2 MG/ML IV SOLN
2.0000 mg | INTRAVENOUS | Status: DC | PRN
  Administered 2020-02-06: 2 mg via INTRAVENOUS
  Filled 2020-02-06: qty 1

## 2020-02-06 MED ORDER — MIDAZOLAM HCL 5 MG/5ML IJ SOLN
INTRAMUSCULAR | Status: DC | PRN
  Administered 2020-02-06: 2 mg via INTRAVENOUS

## 2020-02-06 MED ORDER — CHLORHEXIDINE GLUCONATE 0.12 % MT SOLN: 15.0000 mL | Freq: Once | OROMUCOSAL | Status: AC

## 2020-02-06 MED ORDER — DEXAMETHASONE SODIUM PHOSPHATE 10 MG/ML IJ SOLN
INTRAMUSCULAR | Status: AC
  Filled 2020-02-06: qty 1

## 2020-02-06 MED ORDER — PHENOL 1.4 % MT LIQD: 1.0000 | OROMUCOSAL | Status: DC | PRN

## 2020-02-06 MED ORDER — SODIUM CHLORIDE 0.9% FLUSH: 3.0000 mL | INTRAVENOUS | Status: DC | PRN

## 2020-02-06 MED ORDER — METHOCARBAMOL 500 MG PO TABS
ORAL_TABLET | ORAL | Status: AC
  Filled 2020-02-06: qty 1

## 2020-02-06 MED ORDER — ACETAMINOPHEN 650 MG RE SUPP: 650.0000 mg | RECTAL | Status: DC | PRN

## 2020-02-06 MED ORDER — CEFAZOLIN SODIUM-DEXTROSE 2-4 GM/100ML-% IV SOLN
2.0000 g | Freq: Three times a day (TID) | INTRAVENOUS | Status: AC
  Administered 2020-02-06 – 2020-02-07 (×2): 2 g via INTRAVENOUS
  Filled 2020-02-06 (×2): qty 100

## 2020-02-06 MED ORDER — CHLORHEXIDINE GLUCONATE CLOTH 2 % EX PADS: 6.0000 | MEDICATED_PAD | Freq: Once | CUTANEOUS | Status: DC

## 2020-02-06 MED ORDER — LACTATED RINGERS IV SOLN: INTRAVENOUS | Status: DC

## 2020-02-06 MED ORDER — THROMBIN 20000 UNITS EX SOLR: CUTANEOUS | Status: DC | PRN

## 2020-02-06 MED ORDER — CEFAZOLIN SODIUM-DEXTROSE 2-4 GM/100ML-% IV SOLN
2.0000 g | INTRAVENOUS | Status: AC
  Administered 2020-02-06: 2 g via INTRAVENOUS

## 2020-02-06 MED ORDER — THROMBIN 5000 UNITS EX SOLR
CUTANEOUS | Status: AC
  Filled 2020-02-06: qty 5000

## 2020-02-06 MED ORDER — CHLORHEXIDINE GLUCONATE 0.12 % MT SOLN
OROMUCOSAL | Status: AC
  Administered 2020-02-06: 15 mL via OROMUCOSAL
  Filled 2020-02-06: qty 15

## 2020-02-06 MED ORDER — LIDOCAINE 2% (20 MG/ML) 5 ML SYRINGE
INTRAMUSCULAR | Status: DC | PRN
  Administered 2020-02-06: 100 mg via INTRAVENOUS

## 2020-02-06 MED ORDER — OXYCODONE HCL 5 MG PO TABS
5.0000 mg | ORAL_TABLET | ORAL | Status: DC | PRN
  Administered 2020-02-06 – 2020-02-07 (×5): 5 mg via ORAL
  Filled 2020-02-06 (×4): qty 1

## 2020-02-06 MED ORDER — MENTHOL 3 MG MT LOZG
1.0000 | LOZENGE | OROMUCOSAL | Status: DC | PRN
  Filled 2020-02-06: qty 9

## 2020-02-06 MED ORDER — ONDANSETRON HCL 4 MG/2ML IJ SOLN
INTRAMUSCULAR | Status: DC | PRN
  Administered 2020-02-06: 4 mg via INTRAVENOUS

## 2020-02-06 MED ORDER — OXYCODONE HCL 5 MG PO TABS
ORAL_TABLET | ORAL | Status: AC
  Filled 2020-02-06: qty 1

## 2020-02-06 SURGICAL SUPPLY — 73 items
ANCHOR LUMBAR 25 MIS (Anchor) ×9 IMPLANT
APPLIER CLIP 11 MED OPEN (CLIP) ×3
CANISTER SUCT 3000ML PPV (MISCELLANEOUS) ×3 IMPLANT
CLIP APPLIE 11 MED OPEN (CLIP) ×2 IMPLANT
CLIP LIGATING EXTRA MED SLVR (CLIP) ×3 IMPLANT
CLIP LIGATING EXTRA SM BLUE (MISCELLANEOUS) ×3 IMPLANT
COVER WAND RF STERILE (DRAPES) ×3 IMPLANT
DERMABOND ADVANCED (GAUZE/BANDAGES/DRESSINGS) ×1
DERMABOND ADVANCED .7 DNX12 (GAUZE/BANDAGES/DRESSINGS) ×2 IMPLANT
DRAPE C-ARM 42X72 X-RAY (DRAPES) ×9 IMPLANT
DRAPE LAPAROTOMY 100X72X124 (DRAPES) ×3 IMPLANT
DRSG OPSITE POSTOP 4X6 (GAUZE/BANDAGES/DRESSINGS) ×3 IMPLANT
DURAPREP 26ML APPLICATOR (WOUND CARE) ×3 IMPLANT
ELECT BLADE 4.0 EZ CLEAN MEGAD (MISCELLANEOUS) ×6
ELECT REM PT RETURN 9FT ADLT (ELECTROSURGICAL) ×3
ELECTRODE BLDE 4.0 EZ CLN MEGD (MISCELLANEOUS) ×4 IMPLANT
ELECTRODE REM PT RTRN 9FT ADLT (ELECTROSURGICAL) ×2 IMPLANT
GAUZE SPONGE 4X4 12PLY STRL (GAUZE/BANDAGES/DRESSINGS) ×3 IMPLANT
GLOVE BIO SURGEON STRL SZ7.5 (GLOVE) ×3 IMPLANT
GLOVE BIO SURGEON STRL SZ8 (GLOVE) ×6 IMPLANT
GLOVE BIOGEL PI IND STRL 7.0 (GLOVE) IMPLANT
GLOVE BIOGEL PI IND STRL 8 (GLOVE) ×2 IMPLANT
GLOVE BIOGEL PI INDICATOR 7.0 (GLOVE)
GLOVE BIOGEL PI INDICATOR 8 (GLOVE) ×1
GLOVE SS BIOGEL STRL SZ 7.5 (GLOVE) ×2 IMPLANT
GLOVE SUPERSENSE BIOGEL SZ 7.5 (GLOVE) ×1
GOWN STRL REUS W/ TWL LRG LVL3 (GOWN DISPOSABLE) IMPLANT
GOWN STRL REUS W/ TWL XL LVL3 (GOWN DISPOSABLE) ×2 IMPLANT
GOWN STRL REUS W/TWL 2XL LVL3 (GOWN DISPOSABLE) ×3 IMPLANT
GOWN STRL REUS W/TWL LRG LVL3 (GOWN DISPOSABLE)
GOWN STRL REUS W/TWL XL LVL3 (GOWN DISPOSABLE) ×3
GRAFT TRINITY ELITE LGE HUMAN (Tissue) ×3 IMPLANT
HEMOSTAT POWDER KIT SURGIFOAM (HEMOSTASIS) IMPLANT
HEMOSTAT SNOW SURGICEL 2X4 (HEMOSTASIS) IMPLANT
INSERT FOGARTY 61MM (MISCELLANEOUS) IMPLANT
INSERT FOGARTY SM (MISCELLANEOUS) IMPLANT
KIT BASIN OR (CUSTOM PROCEDURE TRAY) ×3 IMPLANT
KIT TURNOVER KIT B (KITS) ×3 IMPLANT
LOOP VESSEL MAXI BLUE (MISCELLANEOUS) IMPLANT
LOOP VESSEL MINI RED (MISCELLANEOUS) IMPLANT
NEEDLE SPNL 18GX3.5 QUINCKE PK (NEEDLE) ×3 IMPLANT
NS IRRIG 1000ML POUR BTL (IV SOLUTION) ×3 IMPLANT
PACK LAMINECTOMY NEURO (CUSTOM PROCEDURE TRAY) ×3 IMPLANT
PAD ARMBOARD 7.5X6 YLW CONV (MISCELLANEOUS) ×6 IMPLANT
SPACER HEDRON IA 26X34X11 8D (Spacer) ×3 IMPLANT
SPONGE INTESTINAL PEANUT (DISPOSABLE) ×3 IMPLANT
SPONGE LAP 18X18 RF (DISPOSABLE) ×3 IMPLANT
SPONGE LAP 4X18 RFD (DISPOSABLE) IMPLANT
SPONGE SURGIFOAM ABS GEL 100 (HEMOSTASIS) IMPLANT
STAPLER VISISTAT 35W (STAPLE) IMPLANT
SUT PDS AB 1 CTX 36 (SUTURE) ×3 IMPLANT
SUT PROLENE 4 0 RB 1 (SUTURE)
SUT PROLENE 4-0 RB1 .5 CRCL 36 (SUTURE) IMPLANT
SUT PROLENE 5 0 CC1 (SUTURE) IMPLANT
SUT PROLENE 6 0 C 1 30 (SUTURE) ×3 IMPLANT
SUT PROLENE 6 0 CC (SUTURE) IMPLANT
SUT SILK 0 TIES 10X30 (SUTURE) ×3 IMPLANT
SUT SILK 2 0 TIES 10X30 (SUTURE) ×3 IMPLANT
SUT SILK 2 0SH CR/8 30 (SUTURE) IMPLANT
SUT SILK 3 0 TIES 17X18 (SUTURE) ×3
SUT SILK 3 0SH CR/8 30 (SUTURE) IMPLANT
SUT SILK 3-0 18XBRD TIE BLK (SUTURE) ×2 IMPLANT
SUT VIC AB 0 CT1 18XCR BRD8 (SUTURE) ×2 IMPLANT
SUT VIC AB 0 CT1 27 (SUTURE) ×6
SUT VIC AB 0 CT1 27XBRD ANBCTR (SUTURE) ×4 IMPLANT
SUT VIC AB 0 CT1 8-18 (SUTURE) ×3
SUT VIC AB 2-0 CP2 18 (SUTURE) ×3 IMPLANT
SUT VIC AB 3-0 SH 8-18 (SUTURE) ×3 IMPLANT
SUT VICRYL 4-0 PS2 18IN ABS (SUTURE) ×3 IMPLANT
TOWEL GREEN STERILE (TOWEL DISPOSABLE) ×6 IMPLANT
TOWEL GREEN STERILE FF (TOWEL DISPOSABLE) ×9 IMPLANT
TRAY FOLEY MTR SLVR 16FR STAT (SET/KITS/TRAYS/PACK) ×3 IMPLANT
WATER STERILE IRR 1000ML POUR (IV SOLUTION) ×3 IMPLANT

## 2020-02-06 NOTE — Anesthesia Procedure Notes (Signed)
Procedure Name: Intubation Date/Time: 02/06/2020 11:19 AM Performed by: Trinna Post., CRNA Pre-anesthesia Checklist: Patient identified, Emergency Drugs available, Suction available, Patient being monitored and Timeout performed Patient Re-evaluated:Patient Re-evaluated prior to induction Oxygen Delivery Method: Circle system utilized Preoxygenation: Pre-oxygenation with 100% oxygen Induction Type: IV induction Ventilation: Mask ventilation without difficulty Laryngoscope Size: Mac and 3 Grade View: Grade I Tube type: Oral Tube size: 7.0 mm Airway Equipment and Method: Stylet Placement Confirmation: ETT inserted through vocal cords under direct vision,  positive ETCO2 and breath sounds checked- equal and bilateral Secured at: 22 cm Tube secured with: Tape Dental Injury: Teeth and Oropharynx as per pre-operative assessment

## 2020-02-06 NOTE — Op Note (Signed)
Date: February 06, 2020  Preoperative diagnosis: Chronic lower back pain  Postoperative diagnosis: Same  Procedure: Anterior retroperitoneal spine exposure at L5-S1 disc space for ALIF  Surgeon: Dr. Cephus Shelling, MD  Co-surgeon: Dr. Marikay Alar, MD  Indications: Patient is a 44 year old female who has had chronic lower back pain and has been under the care of Dr. Yetta Barre with neurosurgery.  Ultimately she has failed conservative management.  She presents today for planned L5-S1 ALIF after risk benefits discussed.  Vascular surgery was asked to assist with anterior spine exposure.  Findings: Transverse incision over the left rectus muscle and the left rectus muscle was circumferentially mobilized.  Subsequently entered the retroperitoneal space laterally and the peritoneum and left ureter were mobilized across midline exposing the L5-S1 disc space.  The middle sacral vessels were ligated between clips and divided.  The left iliac vein was mobilized laterally.  Fixed Thompson retractor was placed and spinal needle was placed and confirmed on lateral fluoroscopy that we were at the correct L5-S1 level.  Anesthesia: General  Details: Patient was taken to the operating room after informed consent was obtained.  Placed on operative table in supine position.  General endotracheal anesthesia was induced.  Antibiotics were given.  We used fluoroscopic C arm in the lateral position to mark the L5-S1 disc space on the anterior abdominal wall and this was subsequently marked over the left rectus muscle.  Abdominal wall was then prepped and draped in usual sterile fashion.  Timeout was performed to identify patient, procedure and site.  Initially made transverse incision over the left rectus muscle at our mark on the anterior abdominal wall.  Dissected through subcutaneous tissue with Bovie cautery and cerebellum's were used for added visualization.  Once we got to the anterior rectus sheath this was  opened transversely with Bovie cautery.  We used hemostats and small flaps were raised under the anterior rectus sheath and the left rectus muscle was circumferentially mobilized.  Subsequently entered the retroperitoneal space lateral and peritoneum and left ureter were bluntly mobilized across midline with KD.  Dr. Yetta Barre used hand-held Wiley retractors to pull the ureter and peritoneum across midline while the L5-S1 disc space was then exposed by bluntly mobilizing the peritoneum further to midline with suction and a Kd.  Ultimately the middle sacral vessels were identified and ligated between clips and divided.  The left iliac vein was then bluntly mobilized to the left of the disc space.  Ultimately we then placed fixed Thompson retractor.  150 reverse lips were placed on each side of the disc space with a 140 malleable placed cranial to the disc space .  We then put a spinal needle into the space and confirmed on lateral fluoroscopy we were at the correct level of L5-S1.  Case was turned over to Dr. Yetta Barre.  See his dictation for remainder of the case.  Complication: None  Condition: Stable  Cephus Shelling, MD [facility] Office: [phone]   Cephus Shelling

## 2020-02-06 NOTE — Transfer of Care (Signed)
Immediate Anesthesia Transfer of Care Note  Patient: Rossanna R Lafauci  Procedure(s) Performed: Lumbar Five -Sacral One Anterior Lumbar Interbody Fusion (N/A Spine Lumbar) ABDOMINAL EXPOSURE (N/A )  Patient Location: PACU  Anesthesia Type:General  Level of Consciousness: awake, alert , oriented and drowsy  Airway & Oxygen Therapy: Patient Spontanous Breathing and Patient connected to nasal cannula oxygen  Post-op Assessment: Report given to RN and Post -op Vital signs reviewed and stable  Post vital signs: Reviewed and stable  Last Vitals:  Vitals Value Taken Time  BP 139/92 02/06/20 1307  Temp    Pulse 78 02/06/20 1308  Resp 13 02/06/20 1308  SpO2 100 % 02/06/20 1308  Vitals shown include unvalidated device data.  Last Pain:  Vitals:   02/06/20 1037  TempSrc:   PainSc: 5       Patients Stated Pain Goal: 3 (02/06/20 1037)  Complications: No complications documented.

## 2020-02-06 NOTE — H&P (Signed)
Subjective: Patient is a 44 y.o. female admitted for back pain. Onset of symptoms was several months ago, gradually worsening since that time.  The pain is rated severe, and is located at the across the lower back and radiates to leg. The pain is described as aching and occurs all day. The symptoms have been progressive. Symptoms are exacerbated by exercise. MRI or CT showed DDD L5-S1   Past Medical History:  Diagnosis Date  . Anemia   . Back pain   . h/o Adjustment disorder with mixed anxiety and depressed mood 08/18/2007   Qualifier: Diagnosis of  By: Yetta Barre CMA, Chemira    . h/o Cervicalgia 11/02/2015  . History of cardiac murmur in childhood   . History of hysterectomy for benign disease (DUB) 11/01/2000  . Leg numbness   . PONV (postoperative nausea and vomiting)   . Postlaminectomy syndrome, lumbar   . Right leg pain     Past Surgical History:  Procedure Laterality Date  . ABDOMINAL HYSTERECTOMY  2007  . CERVICAL DISC SURGERY  2016  . MICRODISCECTOMY LUMBAR  2020  . MICRODISCECTOMY LUMBAR  2021  . TUBAL LIGATION  2000    Prior to Admission medications   Medication Sig Start Date End Date Taking? Authorizing Provider  ibuprofen (ADVIL) 200 MG tablet Take 800 mg by mouth every 8 (eight) hours as needed for moderate pain.   Yes [provider]  amitriptyline (ELAVIL) 50 MG tablet Take 50 mg by mouth at bedtime. Patient not taking: Reported on 01/24/2020 08/04/19   [provider]  diazepam (VALIUM) 5 MG tablet SMARTSIG:1 Tablet(s) By Mouth Patient not taking: Reported on 01/24/2020 11/24/19   [provider]  naproxen sodium (ANAPROX) 550 MG tablet SMARTSIG:1 Tablet(s) By Mouth Every 12 Hours PRN Patient not taking: Reported on 01/24/2020 09/13/19   [provider]   Allergies  Allergen Reactions  . Codeine Itching    Social History   Tobacco Use  . Smoking status: Never Smoker  . Smokeless tobacco: Never Used  Substance Use Topics  . Alcohol  use: No    Family History  Problem Relation Age of Onset  . Hypertension Mother      Review of Systems  Positive ROS: neg  All other systems have been reviewed and were otherwise negative with the exception of those mentioned in the HPI and as above.  Objective: Vital signs in last 24 hours: Temp:  [97.5 F (36.4 C)] 97.5 F (36.4 C) (10/25 0957) Pulse Rate:  [66] 66 (10/25 0957) Resp:  [18] 18 (10/25 0957) BP: (166)/(74) 166/74 (10/25 0957) SpO2:  [100 %] 100 % (10/25 0957) Weight:  [65.9 kg] 65.9 kg (10/25 0957)  General Appearance: Alert, cooperative, no distress, appears stated age Head: Normocephalic, without obvious abnormality, atraumatic Eyes: PERRL, conjunctiva/corneas clear, EOM's intact    Neck: Supple, symmetrical, trachea midline Back: Symmetric, no curvature, ROM normal, no CVA tenderness Lungs:  respirations unlabored Heart: Regular rate and rhythm Abdomen: Soft, non-tender Extremities: Extremities normal, atraumatic, no cyanosis or edema Pulses: 2+ and symmetric all extremities Skin: Skin color, texture, turgor normal, no rashes or lesions  NEUROLOGIC:   Mental status: Alert and oriented x4,  no aphasia, good attention span, fund of knowledge, and memory Motor Exam - grossly normal Sensory Exam - grossly normal Reflexes: 1+ Coordination - grossly normal Gait - grossly normal Balance - grossly normal Cranial Nerves: I: smell Not tested  II: visual acuity  OS: nl    OD: nl  II: visual fields Full to confrontation  II: pupils Equal, round, reactive to light  III,VII: ptosis None  III,IV,VI: extraocular muscles  Full ROM  V: mastication Normal  V: facial light touch sensation  Normal  V,VII: corneal reflex  Present  VII: facial muscle function - upper  Normal  VII: facial muscle function - lower Normal  VIII: hearing Not tested  IX: soft palate elevation  Normal  IX,X: gag reflex Present  XI: trapezius strength  5/5  XI: sternocleidomastoid  strength 5/5  XI: neck flexion strength  5/5  XII: tongue strength  Normal    Data Review Lab Results  Component Value Date   WBC 7.7 02/01/2020   HGB 13.2 02/01/2020   HCT 40.8 02/01/2020   MCV 89.9 02/01/2020   PLT 343 02/01/2020   Lab Results  Component Value Date   NA 139 02/01/2020   K 4.1 02/01/2020   CL 105 02/01/2020   CO2 25 02/01/2020   BUN 8 02/01/2020   CREATININE 0.80 02/01/2020   GLUCOSE 88 02/01/2020   No results found for: INR, PROTIME  Assessment/Plan:  Estimated body mass index is 27.44 kg/m as calculated from the following:   Height as of this encounter: 5\' 1"  (1.549 m).   Weight as of this encounter: 65.9 kg. Patient admitted for ALIF L5-S1. Patient has failed a reasonable attempt at conservative therapy.  I explained the condition and procedure to the patient and answered any questions.  Patient wishes to proceed with procedure as planned. Understands risks/ benefits and typical outcomes of procedure.   02/06/2020 10:47 AM

## 2020-02-06 NOTE — Op Note (Signed)
02/06/2020  12:50 PM  PATIENT:  Brianna Ball  44 y.o. 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 Chestine Spore  ASSISTANTS: Verlin Dike, FNP  ANESTHESIA:   General  EBL: 125 ml  Total I/O In: 800 [I.V.:700; IV Piggyback:100] Out: 125 [Blood:125]  BLOOD ADMINISTERED: none  DRAINS: None  SPECIMEN:  none  INDICATION FOR PROCEDURE: This patient presented with back and leg pain after 2 previous microdiscectomies at L5-S1. Imaging showed loss of disc base height Modic endplate changes at L5-S1. The patient tried conservative measures without relief. Pain was debilitating. Recommended anterior lumbar interbody fusion L5-S1. Patient understood the risks, benefits, and alternatives and potential outcomes and wished to proceed.  PROCEDURE DETAILS: The patient was taken to the operating room and after induction of adequate generalized endotracheal anesthesia she was placed in the supine position on the operating room table.  Her exposure was performed by Dr. Chestine Spore of vascular surgery and this will be described in a separate operative report.  Once the exposure was done we placed a needle in the disc space and checked AP fluoroscopy to mark her midline.  We then incised the disc base and perform the initial discectomy with pituitary rongeurs.  We released the disc from the endplates with a Cobb elevator.  Then scraped the endplates with curettes.  I used a high-speed drill to drill the endplates to prepare for arthrodesis.  We drilled down to the level of the posterior longitudinal ligament.  We then used sequential trials and the 11 mm 8 degree trial medium sized to fit the best.  We packed a corresponding 3D printed titanium cage  with morselized allograft, and utilizing the inserter inserted this into the disc base utilizing lateral fluoroscopy.  We then placed 3 anchors through the graft, 2 into the sacrum and 1 into the L5 vertebral body.  We then remove the inserter and locked the anchors into place.  We checked our final construct with AP and lateral fluoroscopy.  We remove the retractor looking for any bleeding.  We found none.  We therefore closed the rectus fascia with a running 0 Prolene.  We closed the subcutaneous tissues with 2-0 Vicryl.  Closed the subcuticular tissue with 3-0 Vicryl and closed the skin with Dermabond.  The drapes were removed and the patient was awakened from general anesthesia and transferred to the covering stable condition.  At the end of the procedure all sponge needle and instrument counts were correct.   PLAN OF CARE: Admit to inpatient   PATIENT DISPOSITION:  PACU - hemodynamically stable.   Delay start of Pharmacological VTE agent (>24hrs) due to surgical blood loss or risk of bleeding:  yes

## 2020-02-06 NOTE — Anesthesia Postprocedure Evaluation (Signed)
Anesthesia Post Note  Patient: Brianna Ball  Procedure(s) Performed: Lumbar Five -Sacral One Anterior Lumbar Interbody Fusion (N/A Spine Lumbar) ABDOMINAL EXPOSURE (N/A )     Patient location during evaluation: PACU Anesthesia Type: General Level of consciousness: awake and alert Pain management: pain level controlled Vital Signs Assessment: post-procedure vital signs reviewed and stable Respiratory status: spontaneous breathing, nonlabored ventilation, respiratory function stable and patient connected to nasal cannula oxygen Cardiovascular status: blood pressure returned to baseline and stable Postop Assessment: no apparent nausea or vomiting Anesthetic complications: no   No complications documented.  Last Vitals:  Vitals:   02/06/20 1315 02/06/20 1320  BP:  (!) 145/78  Pulse: 72 68  Resp: 13 13  Temp:    SpO2: 100% 100%    Last Pain:  Vitals:   02/06/20 1305  TempSrc:   PainSc: Asleep                 [name]

## 2020-02-06 NOTE — H&P (Signed)
History and Physical Interval Note:  02/06/2020 10:15 AM  Brianna Ball  has presented today for surgery, with the diagnosis of Post laminectomy syndrome.  The various methods of treatment have been discussed with the patient and family. After consideration of risks, benefits and other options for treatment, the patient has consented to  Procedure(s) with comments: Lumbar 5-Sacral 1 Anterior lumbar interbody fusion (N/A) - 3C/RM 19 ABDOMINAL EXPOSURE (N/A) as a surgical intervention.  The patient's history has been reviewed, patient examined, no change in status, stable for surgery.  I have reviewed the patient's chart and labs.  Questions were answered to the patient's satisfaction.    L5-S1 ALIF.  Risks and benefits discussed again in detail.  Cephus Shelling  Patient name: Brianna Ball            MRN: [id number]        DOB: Dec 11, 1975          Sex: female  REASON FOR CONSULT: Evaluate for L5-S1 ALIF  HPI: Brianna Ball is a 44 y.o. female, with chronic lower back pain and right leg radiculopathy who presents for preop evaluation of scheduled L5-S1 ALIF.  Patient has been under the care of Dr. Yetta Barre with neurosurgery and reports since Covid she started having lower back pain with radiculopathy.  She has had several back surgeries from a posterior approach but no anterior approach.  Previous abdominal surgery includes C-section and hysterectomy.  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      Past Medical History:  Diagnosis Date  . Back pain   . h/o Adjustment disorder with mixed anxiety and depressed mood 08/18/2007   Qualifier: Diagnosis of  By: Yetta Barre CMA, Chemira    . h/o Cervicalgia 11/02/2015  . History of hysterectomy for benign disease (DUB) 11/01/2000  . Leg numbness   . Postlaminectomy syndrome, lumbar   . Right leg pain          Past Surgical History:  Procedure Laterality Date  . ABDOMINAL HYSTERECTOMY  2007  . CERVICAL DISC SURGERY  2016  . TUBAL LIGATION   2000         Family History  Problem Relation Age of Onset  . Hypertension Mother     SOCIAL HISTORY: Social History        Socioeconomic History  . Marital status: Married    Spouse name: Not on file  . Number of children: Not on file  . Years of education: Not on file  . Highest education level: Not on file  Occupational History  . Not on file  Tobacco Use  . Smoking status: Never Smoker  . Smokeless tobacco: Never Used  Substance and Sexual Activity  . Alcohol use: No  . Drug use: No  . Sexual activity: Yes  Other Topics Concern  . Not on file  Social History Narrative  . Not on file   Social Determinants of Health      Financial Resource Strain:   . Difficulty of Paying Living Expenses: Not on file  Food Insecurity:   . Worried About Programme researcher, broadcasting/film/video in the Last Year: Not on file  . Ran Out of Food in the Last Year: Not on file  Transportation Needs:   . Lack of Transportation (Medical): Not on file  . Lack of Transportation (Non-Medical): Not on file  Physical Activity:   . Days of Exercise per Week: Not on file  . Minutes of Exercise per Session:  Not on file  Stress:   . Feeling of Stress : Not on file  Social Connections:   . Frequency of Communication with Friends and Family: Not on file  . Frequency of Social Gatherings with Friends and Family: Not on file  . Attends Religious Services: Not on file  . Active Member of Clubs or Organizations: Not on file  . Attends Banker Meetings: Not on file  . Marital Status: Not on file  Intimate Partner Violence:   . Fear of Current or Ex-Partner: Not on file  . Emotionally Abused: Not on file  . Physically Abused: Not on file  . Sexually Abused: Not on file        Allergies  Allergen Reactions  . Codeine Itching          Current Outpatient Medications  Medication Sig Dispense Refill  . amitriptyline (ELAVIL) 50 MG tablet Take 50 mg by mouth at bedtime. (Patient not  taking: Reported on 01/24/2020)    . diazepam (VALIUM) 5 MG tablet SMARTSIG:1 Tablet(s) By Mouth (Patient not taking: Reported on 01/24/2020)    . ibuprofen (ADVIL) 200 MG tablet Take 800 mg by mouth every 8 (eight) hours as needed for moderate pain.    . naproxen sodium (ANAPROX) 550 MG tablet SMARTSIG:1 Tablet(s) By Mouth Every 12 Hours PRN (Patient not taking: Reported on 01/24/2020)     No current facility-administered medications for this visit.    REVIEW OF SYSTEMS:  [X]  denotes positive finding, [ ]  denotes negative finding Cardiac  Comments:  Chest pain or chest pressure:    Shortness of breath upon exertion:    Short of breath when lying flat:    Irregular heart rhythm:        Vascular    Pain in calf, thigh, or hip brought on by ambulation:    Pain in feet at night that wakes you up from your sleep:     Blood clot in your veins:    Leg swelling:         Pulmonary    Oxygen at home:    Productive cough:     Wheezing:         Neurologic    Sudden weakness in arms or legs:  x Right  Sudden numbness in arms or legs:  x Right  Sudden onset of difficulty speaking or slurred speech:    Temporary loss of vision in one eye:     Problems with dizziness:         Gastrointestinal    Blood in stool:     Vomited blood:         Genitourinary    Burning when urinating:     Blood in urine:        Psychiatric    Major depression:         Hematologic    Bleeding problems:    Problems with blood clotting too easily:        Skin    Rashes or ulcers:        Constitutional    Fever or chills:      PHYSICAL EXAM:    Vitals:   01/31/20 1035  BP: (!) 153/100  Pulse: 74  Resp: 16  Temp: 98.4 F (36.9 C)  TempSrc: Temporal  SpO2: 97%  Weight: 142 lb (64.4 kg)  Height: 5\' 1"  (1.549 m)    GENERAL: The patient is a well-nourished female, in no acute distress.  The vital signs  are documented above. CARDIAC: There is a regular rate and rhythm.  VASCULAR:  Palpable DP PT pulses bilaterally Feet arm warm and well perfused PULMONARY: No respiratory distress. ABDOMEN: Soft and non-tender.  Previous lower pfannestiel incision.  Umbilical incision. MUSCULOSKELETAL: There are no major deformities or cyanosis. NEUROLOGIC: No focal weakness or paresthesias are detected. SKIN: There are no ulcers or rashes noted. PSYCHIATRIC: The patient has a normal affect.  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  Assessment/Plan:  44 year old female with chronic lower back pain and right leg radiculopathy who presents for preop evaluation of L5-S1 ALIF.  I discussed plans for a transverse incision over the left rectus muscle with mobilization of the left rectus as well as peritoneum/intestines and left ureter across midline and mobilization of the left iliac artery and vein for exposure of the disc space from anterior approach.  We discussed risk and benefits including injury to the above structures.  I have reviewed her most recent imaging including MR and I think she would be an excellent candidate for anterior approach.  Look forward to assisting Dr. Yetta Barre on Monday.   Cephus Shelling, MD [facility] Office: [phone]

## 2020-02-07 ENCOUNTER — Encounter (HOSPITAL_COMMUNITY): Payer: Self-pay | Admitting: Neurological Surgery

## 2020-02-07 MED ORDER — OXYCODONE-ACETAMINOPHEN 5-325 MG PO TABS
1.0000 | ORAL_TABLET | ORAL | 0 refills | Status: AC | PRN
Start: 2020-02-07 — End: 2021-02-06

## 2020-02-07 MED ORDER — ONDANSETRON HCL 4 MG PO TABS: 4.0000 mg | ORAL_TABLET | Freq: Three times a day (TID) | ORAL | 1 refills | Status: AC | PRN

## 2020-02-07 MED ORDER — METHOCARBAMOL 500 MG PO TABS: 500.0000 mg | ORAL_TABLET | Freq: Four times a day (QID) | ORAL | 0 refills | Status: DC

## 2020-02-07 NOTE — Discharge Summary (Addendum)
Physician Discharge Summary  Patient ID: Brianna Ball MRN: [id number] DOB/AGE: [birth year] 44 y.o.  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 The patient tolerated the procedure well and was taken to the recovery room and then to the floor in stable condition. The hospital course was routine. There were no complications. The wound remained clean dry and intact. Pt had appropriate back soreness. No complaints of leg pain or new N/T/W. The patient remained afebrile with stable vital signs, and tolerated a regular diet. The patient continued to increase activities, and pain was well controlled with oral pain medications.   Consults: None  Significant Diagnostic Studies:  Results for orders placed or performed during the hospital encounter of 02/06/20  ABO/Rh  Result Value Ref Range   ABO/RH(D)      A NEG Performed at [facility], [address], [place], [place] [address]     DG Lumbar Spine 2-3 Views  Result Date: 02/06/2020 CLINICAL DATA:  Surgery, elective. Additional history provided: L5-S1 ALIF. Provided fluoroscopy time: 36 seconds (23.9 mGy). EXAM: LUMBAR SPINE - 2-3 VIEW; DG C-ARM 1-60 MIN COMPARISON:  Radiographs of the lumbar spine 12/08/2019. FINDINGS: PA and lateral view intraoperative fluoroscopic images of the lumbar spine are submitted, two images total. The images demonstrate new anterior lumbar interbody fusion hardware at L5-S1. A metallic surgical instrument projects ventral to the L5-S1 disc space on the lateral view image. IMPRESSION: Two intraoperative fluoroscopic images of the lumbar spine from reported L5-S1 ALIF as described. Electronically Signed   By: Jackey Loge DO   On: 02/06/2020 12:40   DG C-Arm 1-60 Min  Result Date: 02/06/2020 CLINICAL DATA:  Surgery, elective. Additional history provided: L5-S1 ALIF. Provided fluoroscopy time: 36 seconds (23.9 mGy). EXAM: LUMBAR SPINE - 2-3 VIEW; DG C-ARM 1-60 MIN COMPARISON:  Radiographs of the lumbar spine 12/08/2019. FINDINGS: PA and lateral view intraoperative fluoroscopic images of the lumbar spine are submitted, two images total. The images demonstrate new anterior lumbar interbody fusion hardware at L5-S1. A metallic surgical instrument projects ventral to the L5-S1 disc space on the lateral view image. IMPRESSION: Two intraoperative fluoroscopic images of the lumbar spine from reported L5-S1 ALIF as described. Electronically Signed   By: Jackey Loge DO   On: 02/06/2020 12:40   DG OR LOCAL ABDOMEN  Result Date: 02/06/2020 CLINICAL DATA:  Status post lumbar fusion. EXAM: OR LOCAL ABDOMEN COMPARISON:  None. FINDINGS: The bowel gas pattern is normal. Phleboliths are noted in the pelvis. Status post surgical anterior fusion of L5-S1. No other significant radiopaque foreign body is noted. IMPRESSION: Status post surgical anterior fusion of L5-S1. No other significant radiopaque foreign body is noted. These results were called by telephone at the time of interpretation on 02/06/2020 at 12:46 pm to provider, who verbally acknowledged these results. Electronically Signed   By: Lupita Raider M.D.   On: 02/06/2020 12:47    Antibiotics:  Anti-infectives (From admission, onward)   Start     Dose/Rate Route Frequency Ordered Stop   02/06/20 1930  ceFAZolin (ANCEF) IVPB 2g/100 mL premix        2 g 200 mL/hr over 30 Minutes Intravenous Every 8 hours 02/06/20 1422 02/07/20 0251  02/06/20 1045  ceFAZolin (ANCEF) IVPB 2g/100 mL premix        2 g 200 mL/hr over 30 Minutes Intravenous On call to O.R. 02/06/20 1030 02/06/20 1153      Discharge Exam: Blood pressure (!) 96/57, pulse (!) 53, temperature 98.4 F  (36.9 C), temperature source Oral, resp. rate 18, height 5\' 1"  (1.549 m), weight 65.9 kg, SpO2 99 %. Neurologic: Grossly normal Ambulating and voiding well, incision cdi  Discharge Medications:   Allergies as of 02/07/2020      Reactions   Codeine Itching      Medication List    STOP taking these medications   amitriptyline 50 MG tablet Commonly known as: ELAVIL   diazepam 5 MG tablet Commonly known as: VALIUM   ibuprofen 200 MG tablet Commonly known as: ADVIL   naproxen sodium 550 MG tablet Commonly known as: ANAPROX     TAKE these medications   methocarbamol 500 MG tablet Commonly known as: Robaxin Take 1 tablet (500 mg total) by mouth 4 (four) times daily.   ondansetron 4 MG tablet Commonly known as: Zofran Take 1 tablet (4 mg total) by mouth every 8 (eight) hours as needed for nausea or vomiting.   oxyCODONE-acetaminophen 5-325 MG tablet Commonly known as: Percocet Take 1 tablet by mouth every 4 (four) hours as needed for severe pain.            Durable Medical Equipment  (From admission, onward)         Start     Ordered   02/06/20 1423  DME Walker rolling  Once       Question:  Patient needs a walker to treat with the following condition  Answer:  S/P lumbar fusion   02/06/20 1422   02/06/20 1423  DME 3 n 1  Once        02/06/20 1422          Disposition: home   Final Dx: ALIF L5-S1  Discharge Instructions     Remove dressing in 72 hours   Complete by: As directed    Call MD for:  difficulty breathing, headache or visual disturbances   Complete by: As directed    Call MD for:  hives   Complete by: As directed    Call MD for:  persistant dizziness or light-headedness   Complete by: As directed    Call MD for:  persistant nausea and vomiting   Complete by: As directed    Call MD for:  redness, tenderness, or signs of infection (pain, swelling, redness, odor or green/yellow discharge around incision site)   Complete by: As directed     Call MD for:  severe uncontrolled pain   Complete by: As directed    Call MD for:  temperature >100.4   Complete by: As directed    Diet - low sodium heart healthy   Complete by: As directed    Driving Restrictions   Complete by: As directed    No driving for 1 weeks, car riding to a minimum   Increase activity slowly   Complete by: As directed    Lifting restrictions   Complete by: As directed    No lifting more than 8 lbs         Signed: 02/08/20 Ladarrian Asencio 02/07/2020, 7:58 AM

## 2020-02-07 NOTE — Progress Notes (Signed)
[facility]  Subjective  -no complaints.  Tolerated diet.   Objective (!) 96/57 (!) 53 98.4 F (36.9 C) (Oral) 18 99%  Intake/Output Summary (Last 24 hours) at 02/07/2020 0803 Last data filed at 02/06/2020 1358 Gross per 24 hour  Intake 920 ml  Output 125 ml  Net 795 ml    Left abdominal incision clean dry and intact.  Appropriate postop incisional tenderness.  Laboratory Lab Results: No results for input(s): WBC, HGB, HCT, PLT in the last 72 hours. BMET No results for input(s): NA, K, CL, CO2, GLUCOSE, BUN, CREATININE, CALCIUM in the last 72 hours.  COAG No results found for: INR, PROTIME No results found for: PTT  Assessment/Planning:  Postop day 1 status post L5-S1 ALIF.  Looks really good today.  Palpable left pedal pulse.  Abdominal incision clean dry intact.  Appropriate postop incisional tenderness and tolerating p.o.  Passing gas.  Dispo per neurosurgery.  Looks good from our standpoint.  Cephus Shelling 02/07/2020 8:03 AM --

## 2020-02-07 NOTE — Progress Notes (Signed)
Pt doing well. Pt and husband given D/C instructions with verbal understanding. Rx's were sent to the pharmacy by MD. Pt's incision is clean and dry with no sign of infection. Pt D/C'd home via wheelchair per MD order. Pt is stable @ D/C and has no other needs at this time. Rema Fendt, RN

## 2020-02-07 NOTE — Evaluation (Signed)
Occupational Therapy Evaluation Patient Details Name: Brianna Ball MRN: [id number] DOB: 1976-02-27 Today's Date: 02/07/2020    History of Present Illness Patient is a 44 y.o. female s/p ALIF L5-S1 02/06/2020. PMH includes but not limited to anemia, cardiac murmur in childhood, leg numbness, and adjustment disorder with mixed anxiety and depressed mood.    Clinical Impression   PTA, pt was independent with ADL/IADL and functional mobility. Pt s/p ALIF, presents in bed with her husband present. Pt demonstrated ability to complete full body dressing, donning/doffing back brace and grooming at sink level with modified independence. Pt ambulating without assistance and no instability noted, not formally tested. Pt's husband present and demonstrated ability to provide appropriate level of assistance/supervision as needed. Patient evaluated by Occupational Therapy with no further acute OT needs identified. All education has been completed and the patient has no further questions. See below for any follow-up Occupational Therapy or equipment needs. OT to sign off. Thank you for referral.      Follow Up Recommendations  No OT follow up    Equipment Recommendations  None recommended by OT    Recommendations for Other Services       Precautions / Restrictions Precautions Precautions: Back Precaution Booklet Issued: Yes (comment) (provided handout and reviewed) Required Braces or Orthoses: Spinal Brace Spinal Brace: Lumbar corset;Applied in sitting position Restrictions Weight Bearing Restrictions: No      Mobility Bed Mobility Overal bed mobility: Modified Independent             General bed mobility comments: pt completed log rolling bed mobility technique at modified independent level,     Transfers Overall transfer level: Modified independent Equipment used: None             General transfer comment: completed sit<>stand x3 without LOB good postural awareness and  adherence to back precautions    Balance Overall balance assessment: No apparent balance deficits (not formally assessed)                                         ADL either performed or assessed with clinical judgement   ADL Overall ADL's : Modified independent                                       General ADL Comments: pt demonstrated ability to complete full body dressing (able to figure-4 BLE), grooming at sink level and toilet transfers at modified independent level;pt's husband present and deomstrated ability to provide appropriate level of support/supervision;donned/doffed back brace at modified independent level      Vision Baseline Vision/History: No visual deficits Patient Visual Report: No change from baseline Vision Assessment?: No apparent visual deficits     Perception     Praxis      Pertinent Vitals/Pain Pain Assessment: 0-10 Pain Score: 5  Pain Location: back incision site Pain Descriptors / Indicators: Sore Pain Intervention(s): Limited activity within patient's tolerance;Monitored during session     Hand Dominance Right   Extremity/Trunk Assessment Upper Extremity Assessment Upper Extremity Assessment: Overall WFL for tasks assessed   Lower Extremity Assessment Lower Extremity Assessment: Defer to PT evaluation   Cervical / Trunk Assessment Cervical / Trunk Assessment: Other exceptions (back precautions)   Communication Communication Communication: No difficulties   Cognition Arousal/Alertness: Awake/alert Behavior During Therapy: [facility]  for tasks assessed/performed Overall Cognitive Status: Within Functional Limits for tasks assessed                                 General Comments: good awareness of safety, good adherence to back precautions   General Comments  vss;pt's husband present during session    Exercises     Shoulder Instructions      Home Living Family/patient expects to be  discharged to:: Private residence Living Arrangements: Spouse/significant other Available Help at Discharge: Family Type of Home: House Home Access: Stairs to enter [employer] of Steps: 7 Entrance Stairs-Rails: Can reach both Home Layout: Two level;Able to live on main level with bedroom/bathroom     Bathroom Shower/Tub: Producer, television/film/video: Standard Bathroom Accessibility: Yes How Accessible: Accessible via walker Home Equipment: Shower seat          Prior Functioning/Environment Level of Independence: Independent        Comments: pt is not working, was independent with all ADL/IADL         OT Problem List: Decreased range of motion;Decreased knowledge of precautions;Pain      OT Treatment/Interventions:      OT Goals(Current goals can be found in the care plan section) Acute Rehab OT Goals Patient Stated Goal: to go home today OT Goal Formulation: With patient Time For Goal Achievement: 02/14/20 Potential to Achieve Goals: Good  OT Frequency:     Barriers to D/C:            Co-evaluation              AM-PAC OT "6 Clicks" Daily Activity     Outcome Measure Help from another person eating meals?: None Help from another person taking care of personal grooming?: None Help from another person toileting, which includes using toliet, bedpan, or urinal?: None Help from another person bathing (including washing, rinsing, drying)?: None Help from another person to put on and taking off regular upper body clothing?: None Help from another person to put on and taking off regular lower body clothing?: None 6 Click Score: 24   End of Session Equipment Utilized During Treatment: Back brace Nurse Communication: Mobility status  Activity Tolerance: Patient tolerated treatment well Patient left: in bed;with call bell/phone within reach;with family/visitor present  OT Visit Diagnosis: Other abnormalities of gait and mobility  (R26.89);Pain Pain - Right/Left:  (back) Pain - part of body:  (incision site)                Time: 4403-4742 OT Time Calculation (min): 32 min Charges:  OT General Charges $OT Visit: 1 Visit OT Evaluation $OT Eval Low Complexity: 1 Low OT Treatments $Self Care/Home Management : 8-22 mins  Rosey Bath OTR/L Acute Rehabilitation Services Office: [phone]   Rebeca Alert 02/07/2020, 9:19 AM

## 2020-02-07 NOTE — Evaluation (Signed)
Physical Therapy Evaluation and Discharge Patient Details Name: Brianna Ball MRN: [id number] DOB: 10/14/1975 Today's Date: 02/07/2020   History of Present Illness  Patient is a 44 y.o. female s/p ALIF L5-S1 02/06/2020. PMH significant for anemia and prior back surgeries.    Clinical Impression  Patient evaluated by Physical Therapy with no further acute PT needs identified. All education has been completed and the patient has no further questions. Pt was able to demonstrate transfers and ambulation with gross modified independence without an AD. Pt was educated on precautions, brace application/wearing schedule, appropriate activity progression, and car transfer. See below for any follow-up Physical Therapy or equipment needs. PT is signing off. Thank you for this referral.     Follow Up Recommendations No PT follow up;Supervision - Intermittent    Equipment Recommendations  None recommended by PT    Recommendations for Other Services       Precautions / Restrictions Precautions Precautions: Back Precaution Booklet Issued: Yes (comment) Precaution Comments: Reviewed precautions during functional mobility. Required Braces or Orthoses: Spinal Brace Spinal Brace: Lumbar corset;Applied in sitting position Restrictions Weight Bearing Restrictions: No      Mobility  Bed Mobility Overal bed mobility: Modified Independent             General bed mobility comments: Min use of rails. Likely able to perform without railings. HOB flat to simulate home environment.     Transfers Overall transfer level: Modified independent Equipment used: None             General transfer comment: Pt demonstrated proper hand placement on seated surface for safety.   Ambulation/Gait Ambulation/Gait assistance: Modified independent (Device/Increase time) Gait Distance (Feet): 400 Feet Assistive device: None Gait Pattern/deviations: WFL(Within Functional Limits) Gait velocity:  Decreased Gait velocity interpretation: 1.31 - 2.62 ft/sec, indicative of limited community ambulator General Gait Details: Guarded but grossly within functional limits. No unsteadiness or LOB noted.   Stairs Stairs: Yes Stairs assistance: Modified independent (Device/Increase time) Stair Management: One rail Right;Alternating pattern;Forwards Number of Stairs: 10 General stair comments: Pt demonstrated stair negotiation without difficulty.   Wheelchair Mobility    Modified Rankin (Stroke Patients Only)       Balance Overall balance assessment: No apparent balance deficits (not formally assessed)                                           Pertinent Vitals/Pain Pain Assessment: Faces Pain Score: 5  Faces Pain Scale: Hurts a little bit Pain Location: Abdominal incision site Pain Descriptors / Indicators: Sore;Operative site guarding Pain Intervention(s): Limited activity within patient's tolerance;Monitored during session;Repositioned    Home Living Family/patient expects to be discharged to:: Private residence Living Arrangements: Spouse/significant other Available Help at Discharge: Family Type of Home: House Home Access: Stairs to enter Entrance Stairs-Rails: Can reach both Entrance Stairs-Number of Steps: 7 Home Layout: Two level;Able to live on main level with bedroom/bathroom Home Equipment: Shower seat      Prior Function Level of Independence: Independent         Comments: pt is not working, was independent with all ADL/IADL      Hand Dominance   Dominant Hand: Right    Extremity/Trunk Assessment   Upper Extremity Assessment Upper Extremity Assessment: Defer to OT evaluation    Lower Extremity Assessment Lower Extremity Assessment: Overall WFL for tasks assessed    Cervical /  Trunk Assessment Cervical / Trunk Assessment: Other exceptions (back precautions)  Communication   Communication: No difficulties  Cognition  Arousal/Alertness: Awake/alert Behavior During Therapy: WFL for tasks assessed/performed Overall Cognitive Status: Within Functional Limits for tasks assessed                                 General Comments: good awareness of safety, good adherence to back precautions      General Comments General comments (skin integrity, edema, etc.): vss;pt's husband present during session    Exercises     Assessment/Plan    PT Assessment Patent does not need any further PT services  PT Problem List         PT Treatment Interventions      PT Goals (Current goals can be found in the Care Plan section)  Acute Rehab PT Goals Patient Stated Goal: to go home today PT Goal Formulation: All assessment and education complete, DC therapy    Frequency     Barriers to discharge        Co-evaluation               AM-PAC PT "6 Clicks" Mobility  Outcome Measure Help needed turning from your back to your side while in a flat bed without using bedrails?: None Help needed moving from lying on your back to sitting on the side of a flat bed without using bedrails?: None Help needed moving to and from a bed to a chair (including a wheelchair)?: None Help needed standing up from a chair using your arms (e.g., wheelchair or bedside chair)?: None Help needed to walk in hospital room?: None Help needed climbing 3-5 steps with a railing? : None 6 Click Score: 24    End of Session Equipment Utilized During Treatment: Gait belt;Back brace Activity Tolerance: Patient tolerated treatment well Patient left: in bed;with call bell/phone within reach;with family/visitor present Nurse Communication: Mobility status PT Visit Diagnosis: Pain Pain - part of body:  (abdomen)    Time: 4656-8127 PT Time Calculation (min) (ACUTE ONLY): 15 min   Charges:   PT Evaluation $PT Eval Low Complexity: 1 Low          Conni Slipper, PT, DPT Acute Rehabilitation Services Pager:  [phone] Office: [phone]   Marylynn Pearson 02/07/2020, 10:00 AM

## 2020-02-08 MED FILL — Sodium Chloride IV Soln 0.9%: INTRAVENOUS | Qty: 1000 | Status: AC

## 2020-02-08 MED FILL — Heparin Sodium (Porcine) Inj 1000 Unit/ML: INTRAMUSCULAR | Qty: 30 | Status: AC

## 2020-02-21 ENCOUNTER — Other Ambulatory Visit (HOSPITAL_COMMUNITY): Payer: Self-pay | Admitting: Neurological Surgery

## 2020-02-21 ENCOUNTER — Other Ambulatory Visit: Payer: Self-pay

## 2020-02-21 ENCOUNTER — Ambulatory Visit (HOSPITAL_COMMUNITY)
Admission: RE | Admit: 2020-02-21 | Discharge: 2020-02-21 | Disposition: A | Discharge status: Home / Self Care | Payer: 59 | Source: Ambulatory Visit | Attending: Neurological Surgery | Admitting: Neurological Surgery

## 2020-02-21 DIAGNOSIS — M7989 Other specified soft tissue disorders: Secondary | ICD-10-CM | POA: Diagnosis not present

## 2020-02-21 NOTE — Progress Notes (Signed)
Venous duplex completed. Attempted call report. On hold for 5+ minutes and no answer. Will let patient leave.  Jeb Levering, BS, RDMS, RVT

## 2020-09-27 ENCOUNTER — Telehealth: Payer: Self-pay | Admitting: Physician Assistant

## 2020-09-27 NOTE — Telephone Encounter (Signed)
Patient possibly got bit by spider she believes. It is red, in two different spots on the same arm. Pus is coming out of it, it is draining. Patient has not busted. It is not getting larger by much. Please advise, thanks.

## 2020-09-27 NOTE — Telephone Encounter (Signed)
Unable to see patient today or tomorrow. Suggest she go to urgent care for evaluation and treatment. AS, CMA

## 2021-01-29 ENCOUNTER — Other Ambulatory Visit: Payer: Self-pay | Admitting: Neurological Surgery

## 2021-01-29 DIAGNOSIS — M5417 Radiculopathy, lumbosacral region: Secondary | ICD-10-CM

## 2021-02-18 ENCOUNTER — Other Ambulatory Visit: Payer: Self-pay

## 2021-02-18 ENCOUNTER — Ambulatory Visit
Admission: RE | Admit: 2021-02-18 | Discharge: 2021-02-18 | Disposition: A | Discharge status: Home / Self Care | Payer: 59 | Source: Ambulatory Visit | Attending: Neurological Surgery | Admitting: Neurological Surgery

## 2021-02-18 DIAGNOSIS — M5417 Radiculopathy, lumbosacral region: Secondary | ICD-10-CM

## 2021-02-25 ENCOUNTER — Other Ambulatory Visit: Payer: 59

## 2021-07-01 ENCOUNTER — Encounter: Payer: Self-pay | Admitting: Physician Assistant

## 2022-06-23 ENCOUNTER — Encounter: Payer: Self-pay | Admitting: Family Medicine

## 2022-06-23 ENCOUNTER — Ambulatory Visit: Payer: 59 | Admitting: Family Medicine

## 2022-06-23 VITALS — BP 166/75 | HR 62 | Ht 61.0 in | Wt 147.4 lb

## 2022-06-23 DIAGNOSIS — Z1212 Encounter for screening for malignant neoplasm of rectum: Secondary | ICD-10-CM

## 2022-06-23 DIAGNOSIS — Z130 Encounter for screening for diseases of the blood and blood-forming organs and certain disorders involving the immune mechanism: Secondary | ICD-10-CM

## 2022-06-23 DIAGNOSIS — Z1159 Encounter for screening for other viral diseases: Secondary | ICD-10-CM

## 2022-06-23 DIAGNOSIS — Z1211 Encounter for screening for malignant neoplasm of colon: Secondary | ICD-10-CM

## 2022-06-23 DIAGNOSIS — F5104 Psychophysiologic insomnia: Secondary | ICD-10-CM

## 2022-06-23 DIAGNOSIS — Z1329 Encounter for screening for other suspected endocrine disorder: Secondary | ICD-10-CM | POA: Diagnosis not present

## 2022-06-23 DIAGNOSIS — R635 Abnormal weight gain: Secondary | ICD-10-CM | POA: Insufficient documentation

## 2022-06-23 DIAGNOSIS — I10 Essential (primary) hypertension: Secondary | ICD-10-CM | POA: Insufficient documentation

## 2022-06-23 DIAGNOSIS — E559 Vitamin D deficiency, unspecified: Secondary | ICD-10-CM

## 2022-06-23 DIAGNOSIS — Z13228 Encounter for screening for other metabolic disorders: Secondary | ICD-10-CM

## 2022-06-23 DIAGNOSIS — Z0000 Encounter for general adult medical examination without abnormal findings: Secondary | ICD-10-CM | POA: Diagnosis not present

## 2022-06-23 DIAGNOSIS — R030 Elevated blood-pressure reading, without diagnosis of hypertension: Secondary | ICD-10-CM

## 2022-06-23 DIAGNOSIS — Z136 Encounter for screening for cardiovascular disorders: Secondary | ICD-10-CM

## 2022-06-23 DIAGNOSIS — Z114 Encounter for screening for human immunodeficiency virus [HIV]: Secondary | ICD-10-CM

## 2022-06-23 MED ORDER — MIRTAZAPINE 15 MG PO TABS: ORAL_TABLET | ORAL | 1 refills | Status: DC

## 2022-06-23 NOTE — Progress Notes (Signed)
Complete physical exam  Patient: Brianna Ball   DOB: 01/03/1976   47 y.o. Female  MRN: [id number]  Subjective:    Chief Complaint  Patient presents with   Weight Gain    COBIE PIZER is a 47 y.o. female who presents today for a complete physical exam. She reports consuming a  well-balanced  diet. Gym/ health club routine includes a mix of activities with her soon-to-be daughter-in-law who is a wellness/fitness instructor/coach. She generally feels well. She reports sleeping poorly, for several years now she has no difficulty falling asleep but finds herself waking up frequently with racing thoughts and anxiety about all the things she has to do.  She would also like to discuss difficulties with weight management at some point in the future.  She still sees pain management through neurology once annually and they have put her on pregabalin at bedtime to help with restless leg syndrome.   Most recent fall risk assessment:    06/23/2022    8:15 AM  Fall Risk   Falls in the past year? 0  Number falls in past yr: 0  Injury with Fall? 0  Follow up Falls evaluation completed     Most recent depression screenings:    01/03/2020   10:14 AM 02/03/2019    2:05 PM  PHQ 2/9 Scores  PHQ - 2 Score 1 5  PHQ- 9 Score 5 13   Patient Active Problem List   Diagnosis Date Noted   Psychophysiological insomnia 06/23/2022   Weight gain 06/23/2022   Elevated blood pressure reading 06/23/2022   S/P lumbar fusion 02/06/2020   Chronic back pain 01/31/2020   Vitamin D insufficiency 12/05/2015   High serum high density lipoprotein (HDL) 12/05/2015   Radicular pain 11/02/2015   Neck pain 07/14/2014   b/l fibrocystic breast dx 08/19/2007   h/o Adjustment disorder with mixed anxiety and depressed mood 08/18/2007   History of hysterectomy for benign disease (DUB) 11/01/2000    Past Surgical History:  Procedure Laterality Date   ABDOMINAL EXPOSURE N/A 02/06/2020   Procedure: ABDOMINAL EXPOSURE;   Surgeon: Marty Heck, MD;  Location: [facility];  Service: Vascular;  Laterality: N/A;   ABDOMINAL HYSTERECTOMY  2007   ANTERIOR LUMBAR FUSION N/A 02/06/2020   Procedure: Lumbar Five -Sacral One Anterior Lumbar Interbody Fusion;  Surgeon: Eustace Moore, MD;  Location: [facility];  Service: Neurosurgery;  Laterality: N/A;   CERVICAL DISC SURGERY  2016   MICRODISCECTOMY LUMBAR  2020   MICRODISCECTOMY LUMBAR  2021   TUBAL LIGATION  2000   Social History   Tobacco Use   Smoking status: Never   Smokeless tobacco: Never  Vaping Use   Vaping Use: Never used  Substance Use Topics   Alcohol use: No   Drug use: No   Family History  Problem Relation Age of Onset   Hypertension Mother    Allergies  Allergen Reactions   Codeine Itching     Patient Care Team: Velva Harman, PA as PCP - General (Family Medicine) [facility], MD as Consulting Physician (Neurosurgery)   Outpatient Medications Prior to Visit  Medication Sig   LYRICA 25 MG capsule Take 25 mg by mouth daily.   [DISCONTINUED] methocarbamol (ROBAXIN) 500 MG tablet Take 1 tablet (500 mg total) by mouth 4 (four) times daily.   No facility-administered medications prior to visit.    Review of Systems  Constitutional:  Negative for chills, fever  and malaise/fatigue.  HENT:  Negative for congestion and hearing loss.   Eyes:  Negative for blurred vision and double vision.  Respiratory:  Negative for cough and shortness of breath.   Cardiovascular:  Negative for chest pain, palpitations and leg swelling.  Gastrointestinal:  Negative for abdominal pain, constipation, diarrhea and heartburn.  Genitourinary:  Negative for frequency and urgency.  Musculoskeletal:  Positive for neck pain (Chronic). Negative for myalgias.  Neurological:  Negative for headaches.  Endo/Heme/Allergies:  Negative for polydipsia.  Psychiatric/Behavioral:  Negative for depression. The patient has insomnia. The  patient is not nervous/anxious.        Objective:    BP (!) 166/75   Pulse 62   Ht '5\' 1"'$  (1.549 m)   Wt 147 lb 6.4 oz (66.9 kg)   SpO2 98%   BMI 27.85 kg/m   Physical Exam Constitutional:      General: She is not in acute distress.    Appearance: Normal appearance.  HENT:     Head: Normocephalic and atraumatic.     Right Ear: Tympanic membrane and ear canal normal.     Left Ear: Tympanic membrane and ear canal normal.     Nose: Nose normal.     Mouth/Throat:     Mouth: Mucous membranes are moist.     Pharynx: No oropharyngeal exudate or posterior oropharyngeal erythema.  Eyes:     Extraocular Movements: Extraocular movements intact.     Pupils: Pupils are equal, round, and reactive to light.  Cardiovascular:     Rate and Rhythm: Normal rate and regular rhythm.     Heart sounds: Normal heart sounds.  Pulmonary:     Effort: Pulmonary effort is normal.     Breath sounds: Normal breath sounds.  Abdominal:     General: Abdomen is flat. Bowel sounds are normal.  Musculoskeletal:        General: Normal range of motion.     Cervical back: Normal range of motion and neck supple.  Skin:    General: Skin is warm and dry.  Neurological:     Mental Status: She is alert and oriented to person, place, and time.  Psychiatric:        Mood and Affect: Mood normal.       Assessment & Plan:    Routine Health Maintenance and Physical Exam  Immunization History  Administered Date(s) Administered   PFIZER(Purple Top)SARS-COV-2 Vaccination 12/19/2019   Tdap 11/02/2015    Health Maintenance  Topic Date Due   COLONOSCOPY (Pts 45-25yr Insurance coverage will need to be confirmed)  Never done   PAP SMEAR-Modifier  08/30/2019   INFLUENZA VACCINE  07/13/2022 (Originally 11/12/2021)   COVID-19 Vaccine (2 - 2023-24 season) 12/14/2022 (Originally 12/13/2021)   DTaP/Tdap/Td (2 - Td or Tdap) 11/01/2025   Hepatitis C Screening  Completed   HIV Screening  Completed   HPV VACCINES  Aged Out    Patient declined flu and is otherwise up-to-date on vaccinations.  Mammogram and Pap smear are ordered/tracked by OB/GYN.  Patient agreed to colonoscopy today, referral sent.  We also discussed screening for HIV and hepatitis C per recommendation guidelines and she is agreeable to do this today.  Discussed health benefits of physical activity, and encouraged her to engage in regular exercise appropriate for her age and condition.  Wellness examination -     CBC with Differential/Platelet; Future -     Comprehensive metabolic panel; Future -     Hemoglobin A1c; Future -  Lipid panel; Future -     VITAMIN D 25 Hydroxy (Vit-D Deficiency, Fractures); Future  Vitamin D insufficiency -     VITAMIN D 25 Hydroxy (Vit-D Deficiency, Fractures); Future  Encounter for screening for cardiovascular disorders -     Lipid panel; Future  Screening for endocrine, metabolic and immunity disorder -     CBC with Differential/Platelet; Future -     Comprehensive metabolic panel; Future -     Hemoglobin A1c; Future -     TSH; Future  Weight gain Assessment & Plan: Mother has a history of thyroid issues.  Screening for TSH today.  We also discussed that weight changes can be associated with menopause.  She did have a tubal ligation and abdominal hysterectomy many years ago, but is unsure if they took her ovaries or not.  Chart does not have any recorded history of oophorectomy, so it is possible that symptoms are related to menopausal hormonal changes.  Encouraged her to schedule a follow-up in the next couple of weeks to discuss weight management options.  She will also call her insurance to see what weight loss medications are covered with her insurance plan prior to our follow-up appointment.  Orders: -     TSH; Future  Screening for HIV without presence of risk factors -     HIV Antibody (routine testing w rflx); Future  Encounter for hepatitis C screening test for low risk patient -      Hepatitis C antibody; Future  Psychophysiological insomnia Assessment & Plan: Patient has struggled to stay asleep for many years, she believes it is related to anxiety.  We discussed options to help her stay asleep but she does not struggle with falling asleep at all.  We will start a low-dose of Remeron 7.5 mg nightly before bed.  This should help with staying asleep as well as stabilizing her mood.  We will check in at her follow-up in the next couple of weeks to see how this is going.  In the future if Remeron is not effective, we could consider alternatives like ramelteon, Ambien, or trazodone.  Patient educational materials about mirtazapine.  Patient verbalized understanding and is agreeable to this plan.  Orders: -     Mirtazapine; Take 1/2 to 1 tablet (7.'5mg'$  to '15mg'$ ) nightly at bedtime.  Dispense: 30 tablet; Refill: 1  Screening for colorectal cancer -     Ambulatory referral to Gastroenterology  Elevated blood pressure reading Assessment & Plan: Blood pressure elevated on presentation and remained elevated on recheck 15 minutes later.  Patient stated that blood pressures have been elevated several times in the past, her neurologist believes that the elevation is associated with chronic pain.  We will recheck blood pressure at her follow-up appointment, and at that point if still elevated may need to discuss further pain management options and for her to discuss this with the neurology pain management clinic.   Return in about 2 weeks (around 07/07/2022) for follow-up on weight management.     Velva Harman, PA

## 2022-06-23 NOTE — Assessment & Plan Note (Signed)
Mother has a history of thyroid issues.  Screening for TSH today.  We also discussed that weight changes can be associated with menopause.  She did have a tubal ligation and abdominal hysterectomy many years ago, but is unsure if they took her ovaries or not.  Chart does not have any recorded history of oophorectomy, so it is possible that symptoms are related to menopausal hormonal changes.  Encouraged her to schedule a follow-up in the next couple of weeks to discuss weight management options.  She will also call her insurance to see what weight loss medications are covered with her insurance plan prior to our follow-up appointment.

## 2022-06-23 NOTE — Patient Instructions (Signed)
Podcast: Anti Aging Unraveled with Lori Gerber DO 

## 2022-06-23 NOTE — Assessment & Plan Note (Addendum)
Patient has struggled to stay asleep for many years, she believes it is related to anxiety.  We discussed options to help her stay asleep but she does not struggle with falling asleep at all.  We will start a low-dose of Remeron 7.5 mg nightly before bed.  This should help with staying asleep as well as stabilizing her mood.  We will check in at her follow-up in the next couple of weeks to see how this is going.  In the future if Remeron is not effective, we could consider alternatives like ramelteon, Ambien, or trazodone.  Patient educational materials about mirtazapine.  Patient verbalized understanding and is agreeable to this plan.

## 2022-06-23 NOTE — Assessment & Plan Note (Signed)
Blood pressure elevated on presentation and remained elevated on recheck 15 minutes later.  Patient stated that blood pressures have been elevated several times in the past, her neurologist believes that the elevation is associated with chronic pain.  We will recheck blood pressure at her follow-up appointment, and at that point if still elevated may need to discuss further pain management options and for her to discuss this with the neurology pain management clinic.

## 2022-06-23 NOTE — Assessment & Plan Note (Signed)
>>  ASSESSMENT AND PLAN FOR ELEVATED BLOOD PRESSURE READING WRITTEN ON 06/23/2022  9:34 AM BY Bryden Darden A, PA  Blood pressure elevated on presentation and remained elevated on recheck 15 minutes later.  Patient stated that blood pressures have been elevated several times in the past, her neurologist believes that the elevation is associated with chronic pain.  We will recheck blood pressure at her follow-up appointment, and at that point if still elevated may need to discuss further pain management options and for her to discuss this with the neurology pain management clinic.

## 2022-06-24 LAB — COMPREHENSIVE METABOLIC PANEL
ALT: 19 IU/L (ref 0–32)
AST: 20 IU/L (ref 0–40)
Albumin/Globulin Ratio: 2.3 — ABNORMAL HIGH (ref 1.2–2.2)
Albumin: 4.9 g/dL (ref 3.9–4.9)
Alkaline Phosphatase: 90 IU/L (ref 44–121)
BUN/Creatinine Ratio: 11 (ref 9–23)
BUN: 10 mg/dL (ref 6–24)
Bilirubin Total: 0.5 mg/dL (ref 0.0–1.2)
CO2: 23 mmol/L (ref 20–29)
Calcium: 9.7 mg/dL (ref 8.7–10.2)
Chloride: 102 mmol/L (ref 96–106)
Creatinine, Ser: 0.91 mg/dL (ref 0.57–1.00)
Globulin, Total: 2.1 g/dL (ref 1.5–4.5)
Glucose: 85 mg/dL (ref 70–99)
Potassium: 4.2 mmol/L (ref 3.5–5.2)
Sodium: 139 mmol/L (ref 134–144)
Total Protein: 7 g/dL (ref 6.0–8.5)
eGFR: 77 mL/min/{1.73_m2} (ref >59)

## 2022-06-24 LAB — CBC WITH DIFFERENTIAL/PLATELET
Basophils Absolute: 0.1 10*3/uL (ref 0.0–0.2)
Basos: 1 %
EOS (ABSOLUTE): 0.2 10*3/uL (ref 0.0–0.4)
Eos: 2 %
Hematocrit: 42.9 % (ref 34.0–46.6)
Hemoglobin: 14.5 g/dL (ref 11.1–15.9)
Immature Grans (Abs): 0 10*3/uL (ref 0.0–0.1)
Immature Granulocytes: 0 %
Lymphocytes Absolute: 2.5 10*3/uL (ref 0.7–3.1)
Lymphs: 40 %
MCH: 30.1 pg (ref 26.6–33.0)
MCHC: 33.8 g/dL (ref 31.5–35.7)
MCV: 89 fL (ref 79–97)
Monocytes Absolute: 0.5 10*3/uL (ref 0.1–0.9)
Monocytes: 8 %
Neutrophils Absolute: 3 10*3/uL (ref 1.4–7.0)
Neutrophils: 49 %
Platelets: 337 10*3/uL (ref 150–450)
RBC: 4.81 x10E6/uL (ref 3.77–5.28)
RDW: 12.4 % (ref 11.7–15.4)
WBC: 6.2 10*3/uL (ref 3.4–10.8)

## 2022-06-24 LAB — HEMOGLOBIN A1C
Est. average glucose Bld gHb Est-mCnc: 103 mg/dL
Hgb A1c MFr Bld: 5.2 % (ref 4.8–5.6)

## 2022-06-24 LAB — VITAMIN D 25 HYDROXY (VIT D DEFICIENCY, FRACTURES): Vit D, 25-Hydroxy: 27.3 ng/mL — ABNORMAL LOW (ref 30.0–100.0)

## 2022-06-24 LAB — LIPID PANEL
Chol/HDL Ratio: 4.3 ratio (ref 0.0–4.4)
Cholesterol, Total: 290 mg/dL — ABNORMAL HIGH (ref 100–199)
HDL: 68 mg/dL (ref >39)
LDL Chol Calc (NIH): 196 mg/dL — ABNORMAL HIGH (ref 0–99)
Triglycerides: 146 mg/dL (ref 0–149)
VLDL Cholesterol Cal: 26 mg/dL (ref 5–40)

## 2022-06-24 LAB — SPECIMEN STATUS REPORT

## 2022-06-24 LAB — TSH: TSH: 3.25 u[IU]/mL (ref 0.450–4.500)

## 2022-06-24 LAB — HEPATITIS C ANTIBODY: Hep C Virus Ab: NONREACTIVE

## 2022-06-24 LAB — HIV ANTIBODY (ROUTINE TESTING W REFLEX): HIV Screen 4th Generation wRfx: NONREACTIVE

## 2022-07-02 ENCOUNTER — Encounter: Payer: Self-pay | Admitting: Gastroenterology

## 2022-07-08 ENCOUNTER — Other Ambulatory Visit (HOSPITAL_COMMUNITY): Payer: Self-pay

## 2022-07-08 ENCOUNTER — Encounter: Payer: Self-pay | Admitting: Family Medicine

## 2022-07-08 ENCOUNTER — Ambulatory Visit: Payer: 59 | Admitting: Family Medicine

## 2022-07-08 VITALS — BP 148/88 | HR 75 | Resp 18 | Ht 61.0 in | Wt 152.0 lb

## 2022-07-08 DIAGNOSIS — E559 Vitamin D deficiency, unspecified: Secondary | ICD-10-CM

## 2022-07-08 DIAGNOSIS — I10 Essential (primary) hypertension: Secondary | ICD-10-CM | POA: Diagnosis not present

## 2022-07-08 DIAGNOSIS — E782 Mixed hyperlipidemia: Secondary | ICD-10-CM | POA: Insufficient documentation

## 2022-07-08 DIAGNOSIS — Z6828 Body mass index (BMI) 28.0-28.9, adult: Secondary | ICD-10-CM | POA: Insufficient documentation

## 2022-07-08 DIAGNOSIS — F5104 Psychophysiologic insomnia: Secondary | ICD-10-CM

## 2022-07-08 DIAGNOSIS — E669 Obesity, unspecified: Secondary | ICD-10-CM | POA: Insufficient documentation

## 2022-07-08 MED ORDER — SEMAGLUTIDE-WEIGHT MANAGEMENT 0.25 MG/0.5ML ~~LOC~~ SOAJ
0.2500 mg | SUBCUTANEOUS | 1 refills | Status: DC
  Filled 2022-07-08: qty 2, 28d supply, fill #0

## 2022-07-08 MED ORDER — VITAMIN D (ERGOCALCIFEROL) 1.25 MG (50000 UNIT) PO CAPS: 50000.0000 [IU] | ORAL_CAPSULE | ORAL | 1 refills | Status: DC

## 2022-07-08 MED ORDER — ROSUVASTATIN CALCIUM 5 MG PO TABS: 5.0000 mg | ORAL_TABLET | Freq: Every day | ORAL | 1 refills | Status: DC

## 2022-07-08 MED ORDER — VALSARTAN 80 MG PO TABS: 80.0000 mg | ORAL_TABLET | Freq: Every day | ORAL | 1 refills | Status: DC

## 2022-07-08 NOTE — Assessment & Plan Note (Signed)
Start weekly vitamin D 50,000 units.  Will recheck labs in 2 months, if within normal limits will continue maintenance dose.

## 2022-07-08 NOTE — Assessment & Plan Note (Signed)
Blood pressure has been consistently elevated in office and at home.  Patient does have a family history of hypertension.  Starting valsartan 80 mg daily.  Encouraged to continue checking blood pressure at home, will repeat labs and follow-up in 2 months.

## 2022-07-08 NOTE — Assessment & Plan Note (Signed)
Patient has been following a low fat/low carb diet and exercising regularly for quite some time.  She has a family history of hyperlipidemia, suspect some familial hypercholesterolemia.  Starting rosuvastatin 5 mg daily.  Will recheck lipid panel and hepatic function in about 2 months.

## 2022-07-08 NOTE — Assessment & Plan Note (Signed)
>>  ASSESSMENT AND PLAN FOR BMI 28.0-28.9,ADULT WRITTEN ON 07/08/2022  2:12 PM BY Tylor Gambrill A, PA  Patient has been eating a low-fat/low-carb diet in a caloric deficit and getting regular exercise for at least 6 months now without any success of weight loss.  She is in the overweight category with a BMI of 28.72 currently and has risk factors including hypertension and hyperlipidemia.  Recommend starting Wegovy weekly injections to reduce weight.  Will start with lowest dose for 1 month before titrating up.  For insurance purposes: -Patient has been making lifestyle changes including eating in a caloric deficit and exercising for at least 6 months. -Lab work has been within normal limits, thyroid function normal even with family history. -Patient has BMI 28.72, in the overweight category. -She also has hypertension and hyperlipidemia, both of which are risk factors that meet the criteria for [facility] in conjunction with BMI 28.7 to.

## 2022-07-08 NOTE — Patient Instructions (Signed)
Send me a message in about 3 weeks so I can send the next dose of Wegovy in.  Stay hydrated and eat small meals to avoid nausea.

## 2022-07-08 NOTE — Progress Notes (Addendum)
Established Patient Office Visit  Subjective   Patient ID: Brianna Ball, female    DOB: 12-May-1975  Age: 47 y.o. MRN: [id number]  Chief Complaint  Patient presents with   Follow-up   Lab Results    HPI Brianna Ball is a 47 y.o. female presenting today for follow up of weight management and insomnia. Weight management: Patient would like to lose weight and cites health as reasons for wanting to lose weight.. They have been following a low-fat/low-carb diet. Exercise routine includes training with a wellness/fitness instructor. she has been making efforts for at least 6 months without success.  Insomnia: The Remeron has been helping her sleep really well, though she feels that it may be contributing to some brain fog throughout the day.  She has started cutting her tablets in half.  She is sleeping so well that she would like to continue taking it for another few weeks to give her body time to adjust before talking about switching to something else. Patient is also aware that her LDL remains high every time that it is checked.  She has a family history of hyperlipidemia.  She also has a family history of hypertension, her blood pressure has been elevated at home when she has checked it between [id number] and [id number] systolic.  ROS Negative unless otherwise noted in HPI   Objective:     BP (!) 171/115 (BP Location: Right Arm, Patient Position: Sitting, Cuff Size: Normal)   Pulse 82   Resp 18   Ht 5\' 1"  (1.549 m)   Wt 152 lb (68.9 kg)   SpO2 98%   BMI 28.72 kg/m   Physical Exam Constitutional:      General: She is not in acute distress.    Appearance: Normal appearance.  HENT:     Head: Normocephalic and atraumatic.  Cardiovascular:     Rate and Rhythm: Normal rate and regular rhythm.     Heart sounds: Normal heart sounds. No murmur heard.    No friction rub. No gallop.  Pulmonary:     Effort: Pulmonary effort is normal. No respiratory distress.     Breath sounds: No wheezing,  rhonchi or rales.  Musculoskeletal:     Cervical back: Normal range of motion.  Skin:    General: Skin is warm and dry.  Neurological:     General: No focal deficit present.     Mental Status: She is alert and oriented to person, place, and time. Mental status is at baseline.  Psychiatric:        Mood and Affect: Mood normal.        Thought Content: Thought content normal.        Judgment: Judgment normal.     Assessment & Plan:  Primary hypertension Assessment & Plan: Blood pressure has been consistently elevated in office and at home.  Patient does have a family history of hypertension.  Starting valsartan 80 mg daily.  Encouraged to continue checking blood pressure at home, will repeat labs and follow-up in 2 months.  Orders: -     Valsartan; Take 1 tablet (80 mg total) by mouth daily.  Dispense: 90 tablet; Refill: 1 -     Semaglutide-Weight Management; Inject 0.25 mg into the skin once a week. For one month.  Dispense: 2 mL; Refill: 1  Vitamin D insufficiency Assessment & Plan: Start weekly vitamin D 50,000 units.  Will recheck labs in 2 months, if within normal limits will continue maintenance dose.  Orders: -     Vitamin D (Ergocalciferol); Take 1 capsule (50,000 Units total) by mouth every 7 (seven) days.  Dispense: 5 capsule; Refill: 1  Mixed hyperlipidemia Assessment & Plan: Patient has been following a low fat/low carb diet and exercising regularly for quite some time.  She has a family history of hyperlipidemia, suspect some familial hypercholesterolemia.  Starting rosuvastatin 5 mg daily.  Will recheck lipid panel and hepatic function in about 2 months.  Orders: -     Rosuvastatin Calcium; Take 1 tablet (5 mg total) by mouth daily.  Dispense: 90 tablet; Refill: 1 -     Semaglutide-Weight Management; Inject 0.25 mg into the skin once a week. For one month.  Dispense: 2 mL; Refill: 1  BMI 28.0-28.9,adult Assessment & Plan: Patient has been eating a low-fat/low-carb  diet in a caloric deficit and getting regular exercise for at least 6 months now without any success of weight loss.  She is in the overweight category with a BMI of 28.72 currently and has risk factors including hypertension and hyperlipidemia.  Recommend starting Wegovy weekly injections to reduce weight.  Will start with lowest dose for 1 month before titrating up.  For insurance purposes: -Patient has been making lifestyle changes including eating in a caloric deficit and exercising for at least 6 months. -Lab work has been within normal limits, thyroid function normal even with family history. -Patient has BMI 28.72, in the overweight category. -She also has hypertension and hyperlipidemia, both of which are risk factors that meet the criteria for [facility] in conjunction with BMI 28.7 to.  Orders: -     Semaglutide-Weight Management; Inject 0.25 mg into the skin once a week. For one month.  Dispense: 2 mL; Refill: 1  Psychophysiological insomnia Assessment & Plan: Continue Remeron half tablet 15 mg nightly before bed.  If still causing brain fog after another 3 to 4 weeks, we may consider switching to something like ramelteon or trazodone.     Return in about 2 months (around 09/07/2022) for follow-up for wt management and new meds, fasting blood work 1 week before.    Velva Harman, PA

## 2022-07-08 NOTE — Assessment & Plan Note (Signed)
Patient has been eating a low-fat/low-carb diet in a caloric deficit and getting regular exercise for at least 6 months now without any success of weight loss.  She is in the overweight category with a BMI of 28.72 currently and has risk factors including hypertension and hyperlipidemia.  Recommend starting Wegovy weekly injections to reduce weight.  Will start with lowest dose for 1 month before titrating up.  For insurance purposes: -Patient has been making lifestyle changes including eating in a caloric deficit and exercising for at least 6 months. -Lab work has been within normal limits, thyroid function normal even with family history. -Patient has BMI 28.72, in the overweight category. -She also has hypertension and hyperlipidemia, both of which are risk factors that meet the criteria for [facility] in conjunction with BMI 28.7 to.

## 2022-07-08 NOTE — Assessment & Plan Note (Signed)
Continue Remeron half tablet 15 mg nightly before bed.  If still causing brain fog after another 3 to 4 weeks, we may consider switching to something like ramelteon or trazodone.

## 2022-07-10 ENCOUNTER — Encounter: Payer: Self-pay | Admitting: Family Medicine

## 2022-07-10 ENCOUNTER — Other Ambulatory Visit (HOSPITAL_COMMUNITY): Payer: Self-pay

## 2022-07-24 ENCOUNTER — Ambulatory Visit (AMBULATORY_SURGERY_CENTER): Payer: 59

## 2022-07-24 ENCOUNTER — Encounter: Payer: Self-pay | Admitting: Gastroenterology

## 2022-07-24 VITALS — Ht 61.0 in | Wt 152.0 lb

## 2022-07-24 DIAGNOSIS — Z1211 Encounter for screening for malignant neoplasm of colon: Secondary | ICD-10-CM

## 2022-07-24 NOTE — Progress Notes (Signed)
No egg or soy allergy known to patient  No issues known to pt with past sedation with any surgeries or procedures Patient denies ever being told they had issues or difficulty with intubation  No FH of Malignant Hyperthermia Pt is not on diet pills Pt is not on  home 02  Pt is not on blood thinners  Pt denies issues with constipation  No A fib or A flutter Have any cardiac testing pending--no Pt instructed to use Single[employer]com or GoodRx for a price reduction on prep  Patient's chart reviewed by Brianna Ball CNRA prior to previsit and patient appropriate for the [facility].  Previsit completed and red dot placed by patient's name on their procedure day (on provider's schedule).    

## 2022-08-05 ENCOUNTER — Ambulatory Visit: Payer: 59 | Admitting: Family Medicine

## 2022-08-05 ENCOUNTER — Encounter: Payer: Self-pay | Admitting: Family Medicine

## 2022-08-05 VITALS — BP 169/90 | HR 74 | Resp 18 | Ht 61.0 in | Wt 155.0 lb

## 2022-08-05 DIAGNOSIS — I10 Essential (primary) hypertension: Secondary | ICD-10-CM | POA: Diagnosis not present

## 2022-08-05 DIAGNOSIS — R002 Palpitations: Secondary | ICD-10-CM | POA: Diagnosis not present

## 2022-08-05 DIAGNOSIS — Z6828 Body mass index (BMI) 28.0-28.9, adult: Secondary | ICD-10-CM | POA: Diagnosis not present

## 2022-08-05 MED ORDER — VALSARTAN 160 MG PO TABS: 160.0000 mg | ORAL_TABLET | Freq: Every day | ORAL | 3 refills | Status: DC

## 2022-08-05 NOTE — Patient Instructions (Signed)
We can start phentermine once we get your blood pressure under control and you see cardiology.  Come back to have your fasting insulin measured.  We are increasing your valsartan to 160 mg daily.

## 2022-08-05 NOTE — Progress Notes (Signed)
Established Patient Office Visit  Subjective   Patient ID: Brianna Ball, female    DOB: 06/13/1975  Age: 47 y.o. MRN: [id number]  Chief Complaint  Patient presents with   Hypertension    HPI Brianna Ball is a 47 y.o. female presenting today for follow up of hypertension, weight management.  She also continues to feel a pounding sensation with her heartbeat and is hyperaware of it with intermittent palpitations.  This has never been worked up before.  She is not symptomatic in the office today. Hypertension: Patient here for follow-up of elevated blood pressure, in office was 171/115 at last appointment.  She started valsartan 80 mg daily. She is exercising and is adherent to low salt diet.   Pt denies chest pain, SOB, dizziness, edema, syncope, fatigue or heart palpitations. Taking valsartan 80 mg daily, reports excellent compliance with treatment. Denies side effects. Weight management: Weight has increased 3 lbs since last visit. They have been following a low carb/high protein diet prioritizing lean meats and vegetables. Exercise routine includes walking on the treadmill and walking outside with her dog.  She cannot do any weight lifting because of her back pain but is consistent walking.  At last appointment, we discussed GLP-1 medications for weight loss but her insurance denied it.  ROS Negative unless otherwise noted in HPI   Objective:     BP (!) 169/90 (BP Location: Left Arm, Patient Position: Sitting, Cuff Size: Normal)   Pulse 74   Resp 18   Ht  (1.549 m)   Wt 155 lb (70.3 kg)   SpO2 96%   BMI 29.29 kg/m   Physical Exam Constitutional:      General: She is not in acute distress.    Appearance: Normal appearance.  HENT:     Head: Normocephalic and atraumatic.  Cardiovascular:     Rate and Rhythm: Normal rate and regular rhythm.     Pulses: Normal pulses.     Heart sounds: No murmur heard.    No friction rub. No gallop.  Pulmonary:     Effort: Pulmonary  effort is normal. No respiratory distress.     Breath sounds: No wheezing, rhonchi or rales.  Skin:    General: Skin is warm and dry.  Neurological:     Mental Status: She is alert and oriented to person, place, and time.  Psychiatric:        Mood and Affect: Mood normal.     Assessment & Plan:  Primary hypertension Assessment & Plan: Blood pressure 157/88 on presentation, on repeat 168/90.  Blood pressure has decreased from last appointment but is still above goal.  Increase valsartan to 160 mg daily.  Encouraged to continue ambulatory blood pressure monitoring, will repeat labs in 1 month at follow-up.  Orders: -     Valsartan; Take 1 tablet (160 mg total) by mouth daily.  Dispense: 90 tablet; Refill: 3 -     CBC with Differential/Platelet; Future -     Comprehensive metabolic panel; Future  Palpitations Assessment & Plan: Patient experiences intermittent palpitations and feeling very aware of her heartbeat.  She is not symptomatic in the office but would like a referral to cardiology for workup.  Orders: -     Ambulatory referral to Cardiology  BMI 28.0-28.9,adult Assessment & Plan: Patient continues with low-fat/low-carb diet and a calorie deficit and getting regular exercise.  She has been working with her daughter who is part of the weight loss clinic with  [facility].  We discussed other weight loss medications including phentermine, phentermine-topiramate, naltrexone-bupropion, and orlistat.  We also discussed the option of going to see the [facility].  Patient is most interested in a trial of phentermine, but we are in agreement that we need to get her hypertension under control and let cardiology work her up for palpitations first. Also checking fasting insulin levels for insulin resistance since she continues to gain weight even in a calorie deficit with regular exercise.  Orders: -     Insulin, Free and Total   Return in about 1  week (around 08/12/2022) for fasting lab (insulin); 1 month for follow up for HTN (increased valsartan) and weight management.    Melida Quitter, PA

## 2022-08-05 NOTE — Assessment & Plan Note (Addendum)
Patient continues with low-fat/low-carb diet and a calorie deficit and getting regular exercise.  She has been working with her daughter who is part of the weight loss clinic with [facility].  We discussed other weight loss medications including phentermine, phentermine-topiramate, naltrexone-bupropion, and orlistat.  We also discussed the option of going to see the [facility].  Patient is most interested in a trial of phentermine, but we are in agreement that we need to get her hypertension under control and let cardiology work her up for palpitations first. Also checking fasting insulin levels for insulin resistance since she continues to gain weight even in a calorie deficit with regular exercise.

## 2022-08-05 NOTE — Assessment & Plan Note (Signed)
>>  ASSESSMENT AND PLAN FOR BMI 28.0-28.9,ADULT WRITTEN ON 08/05/2022 10:55 AM BY Keyani Rigdon A, PA  Patient continues with low-fat/low-carb diet and a calorie deficit and getting regular exercise.  She has been working with her daughter who is part of the weight loss clinic with [facility].  We discussed other weight loss medications including phentermine, phentermine-topiramate, naltrexone-bupropion, and orlistat.  We also discussed the option of going to see the [facility].  Patient is most interested in a trial of phentermine, but we are in agreement that we need to get her hypertension under control and let cardiology work her up for palpitations first. Also checking fasting insulin levels for insulin resistance since she continues to gain weight even in a calorie deficit with regular exercise.

## 2022-08-05 NOTE — Assessment & Plan Note (Signed)
Patient experiences intermittent palpitations and feeling very aware of her heartbeat.  She is not symptomatic in the office but would like a referral to cardiology for workup.

## 2022-08-05 NOTE — Assessment & Plan Note (Signed)
Blood pressure 157/88 on presentation, on repeat 168/90.  Blood pressure has decreased from last appointment but is still above goal.  Increase valsartan to 160 mg daily.  Encouraged to continue ambulatory blood pressure monitoring, will repeat labs in 1 month at follow-up.

## 2022-08-12 ENCOUNTER — Other Ambulatory Visit: Payer: Self-pay | Admitting: Family Medicine

## 2022-08-12 ENCOUNTER — Other Ambulatory Visit: Payer: 59

## 2022-08-12 ENCOUNTER — Other Ambulatory Visit: Payer: Self-pay

## 2022-08-12 DIAGNOSIS — Z0000 Encounter for general adult medical examination without abnormal findings: Secondary | ICD-10-CM

## 2022-08-12 DIAGNOSIS — Z6828 Body mass index (BMI) 28.0-28.9, adult: Secondary | ICD-10-CM

## 2022-08-12 DIAGNOSIS — I10 Essential (primary) hypertension: Secondary | ICD-10-CM

## 2022-08-12 DIAGNOSIS — Z130 Encounter for screening for diseases of the blood and blood-forming organs and certain disorders involving the immune mechanism: Secondary | ICD-10-CM

## 2022-08-12 DIAGNOSIS — F5104 Psychophysiologic insomnia: Secondary | ICD-10-CM

## 2022-08-13 LAB — COMPREHENSIVE METABOLIC PANEL
ALT: 52 IU/L — ABNORMAL HIGH (ref 0–32)
AST: 27 IU/L (ref 0–40)
Albumin/Globulin Ratio: 2 (ref 1.2–2.2)
Albumin: 4.5 g/dL (ref 3.9–4.9)
Alkaline Phosphatase: 89 IU/L (ref 44–121)
BUN/Creatinine Ratio: 15 (ref 9–23)
BUN: 13 mg/dL (ref 6–24)
Bilirubin Total: 0.5 mg/dL (ref 0.0–1.2)
CO2: 23 mmol/L (ref 20–29)
Calcium: 9.4 mg/dL (ref 8.7–10.2)
Chloride: 103 mmol/L (ref 96–106)
Creatinine, Ser: 0.88 mg/dL (ref 0.57–1.00)
Globulin, Total: 2.2 g/dL (ref 1.5–4.5)
Glucose: 87 mg/dL (ref 70–99)
Potassium: 4.6 mmol/L (ref 3.5–5.2)
Sodium: 140 mmol/L (ref 134–144)
Total Protein: 6.7 g/dL (ref 6.0–8.5)
eGFR: 81 mL/min/{1.73_m2} (ref >59)

## 2022-08-13 LAB — CBC WITH DIFFERENTIAL/PLATELET
Basophils Absolute: 0.1 10*3/uL (ref 0.0–0.2)
Basos: 1 %
EOS (ABSOLUTE): 0.2 10*3/uL (ref 0.0–0.4)
Eos: 3 %
Hematocrit: 39.9 % (ref 34.0–46.6)
Hemoglobin: 13.4 g/dL (ref 11.1–15.9)
Immature Grans (Abs): 0 10*3/uL (ref 0.0–0.1)
Immature Granulocytes: 0 %
Lymphocytes Absolute: 3 10*3/uL (ref 0.7–3.1)
Lymphs: 45 %
MCH: 29.2 pg (ref 26.6–33.0)
MCHC: 33.6 g/dL (ref 31.5–35.7)
MCV: 87 fL (ref 79–97)
Monocytes Absolute: 0.5 10*3/uL (ref 0.1–0.9)
Monocytes: 8 %
Neutrophils Absolute: 2.9 10*3/uL (ref 1.4–7.0)
Neutrophils: 43 %
Platelets: 294 10*3/uL (ref 150–450)
RBC: 4.59 x10E6/uL (ref 3.77–5.28)
RDW: 12.5 % (ref 11.7–15.4)
WBC: 6.7 10*3/uL (ref 3.4–10.8)

## 2022-08-19 ENCOUNTER — Encounter: Payer: Self-pay | Admitting: Gastroenterology

## 2022-08-19 ENCOUNTER — Ambulatory Visit (AMBULATORY_SURGERY_CENTER): Payer: 59 | Admitting: Gastroenterology

## 2022-08-19 VITALS — BP 137/72 | HR 64 | Temp 96.8°F | Resp 15

## 2022-08-19 DIAGNOSIS — Z1211 Encounter for screening for malignant neoplasm of colon: Secondary | ICD-10-CM | POA: Diagnosis present

## 2022-08-19 NOTE — Patient Instructions (Signed)
Resume previous diet and medications. Repeat Colonoscopy in 10 years for surveillance. Handout provided on Diverticulosis  YOU HAD AN ENDOSCOPIC PROCEDURE TODAY AT THE [facility]:   Refer to the procedure report that was given to you for any specific questions about what was found during the examination.  If the procedure report does not answer your questions, please call your gastroenterologist to clarify.  If you requested that your care partner not be given the details of your procedure findings, then the procedure report has been included in a sealed envelope for you to review at your convenience later.  YOU SHOULD EXPECT: Some feelings of bloating in the abdomen. Passage of more gas than usual.  Walking can help get rid of the air that was put into your GI tract during the procedure and reduce the bloating. If you had a lower endoscopy (such as a colonoscopy or flexible sigmoidoscopy) you may notice spotting of blood in your stool or on the toilet paper. If you underwent a bowel prep for your procedure, you may not have a normal bowel movement for a few days.  Please Note:  You might notice some irritation and congestion in your nose or some drainage.  This is from the oxygen used during your procedure.  There is no need for concern and it should clear up in a day or so.  SYMPTOMS TO REPORT IMMEDIATELY:  Following lower endoscopy (colonoscopy or flexible sigmoidoscopy):  Excessive amounts of blood in the stool  Significant tenderness or worsening of abdominal pains  Swelling of the abdomen that is new, acute  Fever of 100F or higher  For urgent or emergent issues, a gastroenterologist can be reached at any hour by calling [phone]. Do not use MyChart messaging for urgent concerns.    DIET:  We do recommend a small meal at first, but then you may proceed to your regular diet.  Drink plenty of fluids but you should avoid alcoholic beverages for 24 hours.  ACTIVITY:  You  should plan to take it easy for the rest of today and you should NOT DRIVE or use heavy machinery until tomorrow (because of the sedation medicines used during the test).    FOLLOW UP: Our staff will call the number listed on your records the next business day following your procedure.  We will call around 7:15- 8:00 am to check on you and address any questions or concerns that you may have regarding the information given to you following your procedure. If we do not reach you, we will leave a message.     If any biopsies were taken you will be contacted by phone or by letter within the next 1-3 weeks.  Please call us at [phone] if you have not heard about the biopsies in 3 weeks.    SIGNATURES/CONFIDENTIALITY: You and/or your care partner have signed paperwork which will be entered into your electronic medical record.  These signatures attest to the fact that that the information above on your After Visit Summary has been reviewed and is understood.  Full responsibility of the confidentiality of this discharge information lies with you and/or your care-partner.

## 2022-08-19 NOTE — Op Note (Signed)
[facility] Patient Name: Brianna Ball Procedure Date: 08/19/2022 1:41 PM MRN: [id number] Endoscopist: Corliss Parish , MD, [id number] Age: 47 Referring MD:  Date of Birth: 1976/01/20 Gender: Female Account #: [id number] Procedure:                Colonoscopy Indications:              Screening for colorectal malignant neoplasm Medicines:                Monitored Anesthesia Care Procedure:                Pre-Anesthesia Assessment:                           - Prior to the procedure, a History and Physical                            was performed, and patient medications and                            allergies were reviewed. The patient's tolerance of                            previous anesthesia was also reviewed. The risks                            and benefits of the procedure and the sedation                            options and risks were discussed with the patient.                            All questions were answered, and informed consent                            was obtained. Prior Anticoagulants: The patient has                            taken no anticoagulant or antiplatelet agents. ASA                            Grade Assessment: II - A patient with mild systemic                            disease. After reviewing the risks and benefits,                            the patient was deemed in satisfactory condition to                            undergo the procedure.                           After obtaining informed consent, the colonoscope  was passed under direct vision. Throughout the                            procedure, the patient's blood pressure, pulse, and                            oxygen saturations were monitored continuously. The                            Olympus CF-HQ190L [id number]) Colonoscope was                            introduced through the anus and advanced to the 3                            cm into the  ileum. The colonoscopy was somewhat                            difficult due to significant looping. Successful                            completion of the procedure was aided by changing                            the patient's position, using manual pressure,                            straightening and shortening the scope to obtain                            bowel loop reduction and using scope torsion. The                            patient tolerated the procedure. Scope In: 1:47:34 PM Scope Out: 2:03:10 PM Scope Withdrawal Time: 0 hours 8 minutes 17 seconds  Total Procedure Duration: 0 hours 15 minutes 36 seconds  Findings:                 The digital rectal exam was normal. Pertinent                            negatives include no palpable rectal lesions.                           The colon (entire examined portion) revealed                            significantly excessive looping.                           The terminal ileum and ileocecal valve appeared                            normal.  Many small-mouthed diverticula were found in the                            entire colon.                           Normal mucosa was found in the entire colon                            otherwise.                           Non-bleeding non-thrombosed internal hemorrhoids                            were found during retroflexion, during perianal                            exam and during digital exam. The hemorrhoids were                            Grade I (internal hemorrhoids that do not prolapse). Complications:            No immediate complications. Estimated Blood Loss:     Estimated blood loss was minimal. Impression:               - There was significant looping of the colon.                           - Normal visualized terminal ileum.                           - Diverticulosis in the entire examined colon.                           - Normal mucosa in the  entire examined colon                            otherwise.                           - Non-bleeding non-thrombosed internal hemorrhoids. Recommendation:           - The patient will be observed post-procedure,                            until all discharge criteria are met.                           - Discharge patient to home.                           - Patient has a contact number available for                            emergencies. The signs and symptoms of potential  delayed complications were discussed with the                            patient. Return to normal activities tomorrow.                            Written discharge instructions were provided to the                            patient.                           - High fiber diet.                           - Use FiberCon 1-2 tablets PO daily.                           - Continue present medications.                           - Repeat colonoscopy in 10 years for screening                            purposes.                           - The findings and recommendations were discussed                            with the patient.                           - The findings and recommendations were discussed                            with the patient's family. Corliss Parish, MD 08/19/2022 2:10:11 PM

## 2022-08-19 NOTE — Progress Notes (Signed)
Vss mnad mtrans to [employer]

## 2022-08-19 NOTE — Progress Notes (Signed)
GASTROENTEROLOGY PROCEDURE H&P NOTE   Primary Care Physician: Melida Quitter, PA  HPI: Brianna Ball is a 47 y.o. female who presents for Colonoscopy for screening.  Past Medical History:  Diagnosis Date   Anemia    Back pain    h/o Adjustment disorder with mixed anxiety and depressed mood 08/18/2007   Qualifier: Diagnosis of  By: Yetta Barre CMA, Chemira     h/o Cervicalgia 11/02/2015   History of cardiac murmur in childhood    History of hysterectomy for benign disease (DUB) 11/01/2000   Hypertension    Leg numbness    PONV (postoperative nausea and vomiting)    Postlaminectomy syndrome, lumbar    Right leg pain    Past Surgical History:  Procedure Laterality Date   ABDOMINAL EXPOSURE N/A 02/06/2020   Procedure: ABDOMINAL EXPOSURE;  Surgeon: Cephus Shelling, MD;  Location: [facility] OR;  Service: Vascular;  Laterality: N/A;   ABDOMINAL HYSTERECTOMY  2007   ANTERIOR LUMBAR FUSION N/A 02/06/2020   Procedure: Lumbar Five -Sacral One Anterior Lumbar Interbody Fusion;  Surgeon: Tia Alert, MD;  Location: [facility] OR;  Service: Neurosurgery;  Laterality: N/A;   CERVICAL DISC SURGERY  2016   MICRODISCECTOMY LUMBAR  2020   MICRODISCECTOMY LUMBAR  2021   TOTAL ABDOMINAL HYSTERECTOMY Bilateral 2007   TUBAL LIGATION  2000   Current Outpatient Medications  Medication Sig Dispense Refill   LYRICA 25 MG capsule Take 25 mg by mouth daily.     mirtazapine (REMERON) 15 MG tablet TAKE 1/2 TO 1 TABLET (7.5 MG TO 15 MG) BY MOUTH NIGHTLY AT BEDTIME. 30 tablet 1   rosuvastatin (CRESTOR) 5 MG tablet Take 1 tablet (5 mg total) by mouth daily. 90 tablet 1   valsartan (DIOVAN) 160 MG tablet Take 1 tablet (160 mg total) by mouth daily. 90 tablet 3   Vitamin D, Ergocalciferol, (DRISDOL) 1.25 MG (50000 UNIT) CAPS capsule Take 1 capsule (50,000 Units total) by mouth every 7 (seven) days. 5 capsule 1   ibuprofen (ADVIL) 600 MG tablet Take 600 mg by mouth every 6 (six) hours as needed.     No current  facility-administered medications for this visit.    Current Outpatient Medications:    LYRICA 25 MG capsule, Take 25 mg by mouth daily., Disp: , Rfl:    mirtazapine (REMERON) 15 MG tablet, TAKE 1/2 TO 1 TABLET (7.5 MG TO 15 MG) BY MOUTH NIGHTLY AT BEDTIME., Disp: 30 tablet, Rfl: 1   rosuvastatin (CRESTOR) 5 MG tablet, Take 1 tablet (5 mg total) by mouth daily., Disp: 90 tablet, Rfl: 1   valsartan (DIOVAN) 160 MG tablet, Take 1 tablet (160 mg total) by mouth daily., Disp: 90 tablet, Rfl: 3   Vitamin D, Ergocalciferol, (DRISDOL) 1.25 MG (50000 UNIT) CAPS capsule, Take 1 capsule (50,000 Units total) by mouth every 7 (seven) days., Disp: 5 capsule, Rfl: 1   ibuprofen (ADVIL) 600 MG tablet, Take 600 mg by mouth every 6 (six) hours as needed., Disp: , Rfl:  Allergies  Allergen Reactions   Codeine Itching   Family History  Problem Relation Age of Onset   Colon polyps Mother    Hypertension Mother    Colitis Neg Hx    Esophageal cancer Neg Hx    Rectal cancer Neg Hx    Stomach cancer Neg Hx    Social History   Socioeconomic History   Marital status: Married    Spouse name: Not on file   Number of children:  Not on file   Years of education: Not on file   Highest education level: Not on file  Occupational History   Not on file  Tobacco Use   Smoking status: Never    Passive exposure: Never   Smokeless tobacco: Never  Vaping Use   Vaping Use: Never used  Substance and Sexual Activity   Alcohol use: No   Drug use: No   Sexual activity: Yes  Other Topics Concern   Not on file  Social History Narrative   Not on file   Social Determinants of Health   Financial Resource Strain: Not on file  Food Insecurity: Not on file  Transportation Needs: Not on file  Physical Activity: Not on file  Stress: Not on file  Social Connections: Not on file  Intimate Partner Violence: Not on file    Physical Exam: Today's Vitals   08/19/22 1256  Temp: (!) 96.8 F (36 C)   There is no  height or weight on file to calculate BMI. GEN: NAD EYE: Sclerae anicteric ENT: MMM CV: Non-tachycardic GI: Soft, NT/ND NEURO:  Alert & Oriented x 3  Lab Results: No results for input(s): "WBC", "HGB", "HCT", "PLT" in the last 72 hours. BMET No results for input(s): "NA", "K", "CL", "CO2", "GLUCOSE", "BUN", "CREATININE", "CALCIUM" in the last 72 hours. LFT No results for input(s): "PROT", "ALBUMIN", "AST", "ALT", "ALKPHOS", "BILITOT", "BILIDIR", "IBILI" in the last 72 hours. PT/INR No results for input(s): "LABPROT", "INR" in the last 72 hours.   Impression / Plan: This is a 47 y.o.female who presents for Colonoscopy for screening.  The risks and benefits of endoscopic evaluation/treatment were discussed with the patient and/or family; these include but are not limited to the risk of perforation, infection, bleeding, missed lesions, lack of diagnosis, severe illness requiring hospitalization, as well as anesthesia and sedation related illnesses.  The patient's history has been reviewed, patient examined, no change in status, and deemed stable for procedure.  The patient and/or family is agreeable to proceed.    Corliss Parish, MD [facility] Office # [phone]

## 2022-08-19 NOTE — Progress Notes (Signed)
Pt's states no medical or surgical changes since previsit or office visit. 

## 2022-08-20 ENCOUNTER — Telehealth: Payer: Self-pay | Admitting: *Deleted

## 2022-08-20 NOTE — Telephone Encounter (Signed)
  Follow up Call-     08/19/2022   12:56 PM  Call back number  Post procedure Call Back phone  # [phone]  Permission to leave phone message Yes     Patient questions:  Message left to call us if necessary.

## 2022-09-01 ENCOUNTER — Other Ambulatory Visit: Payer: 59

## 2022-09-01 DIAGNOSIS — Z0000 Encounter for general adult medical examination without abnormal findings: Secondary | ICD-10-CM

## 2022-09-01 DIAGNOSIS — I10 Essential (primary) hypertension: Secondary | ICD-10-CM

## 2022-09-01 DIAGNOSIS — Z130 Encounter for screening for diseases of the blood and blood-forming organs and certain disorders involving the immune mechanism: Secondary | ICD-10-CM

## 2022-09-03 LAB — CBC WITH DIFFERENTIAL/PLATELET
EOS (ABSOLUTE): 0.2 10*3/uL (ref 0.0–0.4)
Eos: 3 %
Hemoglobin: 12.9 g/dL (ref 11.1–15.9)
Lymphocytes Absolute: 2.7 10*3/uL (ref 0.7–3.1)
Lymphs: 40 %
MCHC: 32.3 g/dL (ref 31.5–35.7)
MCV: 91 fL (ref 79–97)
Monocytes: 6 %
Neutrophils: 50 %
Platelets: 297 10*3/uL (ref 150–450)
RBC: 4.38 x10E6/uL (ref 3.77–5.28)
RDW: 12.9 % (ref 11.7–15.4)

## 2022-09-03 LAB — INSULIN, FREE AND TOTAL

## 2022-09-03 LAB — COMPREHENSIVE METABOLIC PANEL: Glucose: 84 mg/dL (ref 70–99)

## 2022-09-04 ENCOUNTER — Ambulatory Visit: Payer: 59 | Admitting: Family Medicine

## 2022-09-09 ENCOUNTER — Encounter: Payer: Self-pay | Admitting: Family Medicine

## 2022-09-09 ENCOUNTER — Ambulatory Visit: Payer: 59 | Admitting: Family Medicine

## 2022-09-09 VITALS — BP 134/83 | HR 80 | Resp 18 | Ht 61.0 in | Wt 158.0 lb

## 2022-09-09 DIAGNOSIS — I10 Essential (primary) hypertension: Secondary | ICD-10-CM

## 2022-09-09 DIAGNOSIS — Z6828 Body mass index (BMI) 28.0-28.9, adult: Secondary | ICD-10-CM

## 2022-09-09 MED ORDER — PHENTERMINE HCL 15 MG PO CAPS
15.0000 mg | ORAL_CAPSULE | ORAL | 0 refills | Status: DC
Start: 2022-09-09 — End: 2022-10-07

## 2022-09-09 NOTE — Patient Instructions (Signed)
A key part of managing your weight will be managing blood sugars.  Make it your goal to get 90 g of protein each day and 25 g of fiber each day to help keep blood sugar steady.  If you start to experience palpitations or an increase in blood pressure while on phentermine stop taking it immediately.

## 2022-09-09 NOTE — Progress Notes (Signed)
   Established Patient Office Visit  Subjective   Patient ID: PAYETON ROBERGE, female    DOB: 04/19/1975  Age: 47 y.o. MRN: [id number]  Chief Complaint  Patient presents with   Weight Management Screening    HPI NELLY CRAUN is a 47 y.o. female presenting today for follow up of weight management.  She continues to have an increase in weight despite routine exercise and following a low-carb/high-protein diet.  She has gained an additional 3 pounds since her last visit on 08/05/2022.  All increasing her valsartan has greatly improved her blood pressure and resolved the palpitations that she was previously getting.  She would like to discuss medication support for weight management as she would like to feel her best for her son's upcoming wedding in October.  ROS Negative unless otherwise noted in HPI   Objective:     BP 134/83 (BP Location: Left Arm, Patient Position: Sitting, Cuff Size: Normal)   Pulse 80   Resp 18   Ht 5\' 1"  (1.549 m)   Wt 158 lb (71.7 kg)   SpO2 95%   BMI 29.85 kg/m   Physical Exam Constitutional:      General: She is not in acute distress.    Appearance: Normal appearance.  HENT:     Head: Normocephalic and atraumatic.  Cardiovascular:     Rate and Rhythm: Normal rate and regular rhythm.     Heart sounds: No murmur heard.    No friction rub. No gallop.  Pulmonary:     Effort: Pulmonary effort is normal. No respiratory distress.     Breath sounds: No wheezing, rhonchi or rales.  Skin:    General: Skin is warm and dry.  Neurological:     Mental Status: She is alert and oriented to person, place, and time.     Assessment & Plan:  BMI 28.0-28.9,adult Assessment & Plan: Blood pressure at goal.  Starting low-dose of phentermine for weight management in conjunction with nutritional interventions and routine physical activity.  We discussed that it will be important to monitor her blood pressure and heart rate while taking this medication.  Patient  verbalized understanding to discontinue if she experiences an increase in blood pressure or any palpitations.  If ineffective, she may be a good candidate for phentermine-topiramate.  If mirtazapine is ineffective for managing mood symptoms, then we will also need to discuss management options that could help with appetite as well.  Orders: -     Phentermine HCl; Take 1 capsule (15 mg total) by mouth every morning.  Dispense: 30 capsule; Refill: 0  Primary hypertension Assessment & Plan: Blood pressure at goal <140/90.  We discussed the importance of needing to continue to monitor blood pressure and heart rate since she has elected to trial of low-dose of phentermine.  Patient verbalized understanding that if she does experience an increase in blood pressure or palpitations at home she will discontinue phentermine immediately.  Will continue to monitor.     Return in about 4 weeks (around 10/07/2022) for follow-up for weight management, started phentermine 15 mg daily.    Melida Quitter, PA

## 2022-09-09 NOTE — Assessment & Plan Note (Addendum)
Blood pressure at goal.  Starting low-dose of phentermine for weight management in conjunction with nutritional interventions and routine physical activity.  We discussed that it will be important to monitor her blood pressure and heart rate while taking this medication.  Patient verbalized understanding to discontinue if she experiences an increase in blood pressure or any palpitations.  If ineffective, she may be a good candidate for phentermine-topiramate.  If mirtazapine is ineffective for managing mood symptoms, then we will also need to discuss management options that could help with appetite as well.

## 2022-09-09 NOTE — Assessment & Plan Note (Signed)
>>  ASSESSMENT AND PLAN FOR BMI 28.0-28.9,ADULT WRITTEN ON 09/09/2022 11:14 AM BY Marquis Down A, PA  Blood pressure at goal.  Starting low-dose of phentermine for weight management in conjunction with nutritional interventions and routine physical activity.  We discussed that it will be important to monitor her blood pressure and heart rate while taking this medication.  Patient verbalized understanding to discontinue if she experiences an increase in blood pressure or any palpitations.  If ineffective, she may be a good candidate for phentermine-topiramate.  If mirtazapine is ineffective for managing mood symptoms, then we will also need to discuss management options that could help with appetite as well.

## 2022-09-09 NOTE — Assessment & Plan Note (Signed)
Blood pressure at goal <140/90.  We discussed the importance of needing to continue to monitor blood pressure and heart rate since she has elected to trial of low-dose of phentermine.  Patient verbalized understanding that if she does experience an increase in blood pressure or palpitations at home she will discontinue phentermine immediately.  Will continue to monitor.

## 2022-09-10 ENCOUNTER — Encounter: Payer: Self-pay | Admitting: Family Medicine

## 2022-09-11 LAB — CBC WITH DIFFERENTIAL/PLATELET
Basophils Absolute: 0.1 10*3/uL (ref 0.0–0.2)
Basos: 1 %
Hematocrit: 40 % (ref 34.0–46.6)
Immature Grans (Abs): 0 10*3/uL (ref 0.0–0.1)
Immature Granulocytes: 0 %
MCH: 29.5 pg (ref 26.6–33.0)
Monocytes Absolute: 0.4 10*3/uL (ref 0.1–0.9)
Neutrophils Absolute: 3.3 10*3/uL (ref 1.4–7.0)
WBC: 6.7 10*3/uL (ref 3.4–10.8)

## 2022-09-11 LAB — COMPREHENSIVE METABOLIC PANEL
ALT: 48 IU/L — ABNORMAL HIGH (ref 0–32)
AST: 25 IU/L (ref 0–40)
Albumin/Globulin Ratio: 1.9 (ref 1.2–2.2)
Albumin: 4.2 g/dL (ref 3.9–4.9)
Alkaline Phosphatase: 88 IU/L (ref 44–121)
BUN/Creatinine Ratio: 11 (ref 9–23)
BUN: 9 mg/dL (ref 6–24)
Bilirubin Total: 0.4 mg/dL (ref 0.0–1.2)
CO2: 21 mmol/L (ref 20–29)
Calcium: 9.1 mg/dL (ref 8.7–10.2)
Chloride: 104 mmol/L (ref 96–106)
Creatinine, Ser: 0.81 mg/dL (ref 0.57–1.00)
Globulin, Total: 2.2 g/dL (ref 1.5–4.5)
Potassium: 4.6 mmol/L (ref 3.5–5.2)
Sodium: 139 mmol/L (ref 134–144)
Total Protein: 6.4 g/dL (ref 6.0–8.5)
eGFR: 89 mL/min/{1.73_m2} (ref >59)

## 2022-09-11 LAB — INSULIN, FREE AND TOTAL: Free Insulin: 14 uU/mL

## 2022-09-16 ENCOUNTER — Encounter: Payer: Self-pay | Admitting: Family Medicine

## 2022-09-29 ENCOUNTER — Ambulatory Visit: Payer: 59 | Admitting: Internal Medicine

## 2022-09-30 ENCOUNTER — Encounter: Payer: Self-pay | Admitting: Family Medicine

## 2022-10-07 ENCOUNTER — Ambulatory Visit: Payer: 59 | Admitting: Family Medicine

## 2022-10-07 ENCOUNTER — Encounter: Payer: Self-pay | Admitting: Family Medicine

## 2022-10-07 VITALS — BP 144/84 | HR 82 | Resp 18 | Ht 61.0 in | Wt 158.0 lb

## 2022-10-07 DIAGNOSIS — I10 Essential (primary) hypertension: Secondary | ICD-10-CM | POA: Diagnosis not present

## 2022-10-07 DIAGNOSIS — E782 Mixed hyperlipidemia: Secondary | ICD-10-CM | POA: Diagnosis not present

## 2022-10-07 DIAGNOSIS — Z6828 Body mass index (BMI) 28.0-28.9, adult: Secondary | ICD-10-CM

## 2022-10-07 MED ORDER — PHENTERMINE HCL 15 MG PO CAPS
15.0000 mg | ORAL_CAPSULE | ORAL | 0 refills | Status: DC
Start: 2022-10-07 — End: 2022-12-18

## 2022-10-07 MED ORDER — TOPIRAMATE 25 MG PO TABS
25.0000 mg | ORAL_TABLET | Freq: Two times a day (BID) | ORAL | 1 refills | Status: DC
Start: 2022-10-07 — End: 2022-11-06

## 2022-10-07 MED ORDER — ROSUVASTATIN CALCIUM 5 MG PO TABS: 5.0000 mg | ORAL_TABLET | Freq: Every day | ORAL | 1 refills | Status: DC

## 2022-10-07 NOTE — Assessment & Plan Note (Signed)
Blood pressure at goal <140/90, at goal initially 137/85 but on recheck 141/84.  We discussed the importance of needing to continue to monitor blood pressure and heart rate while on phentermine.  Patient verbalized understanding that if she does experience an increase in blood pressure or palpitations at home she will discontinue phentermine immediately.  Will continue to monitor.

## 2022-10-07 NOTE — Progress Notes (Signed)
Established Patient Office Visit  Subjective   Patient ID: Brianna Ball, female    DOB: May 03, 1975  Age: 47 y.o. MRN: [id number]  Chief Complaint  Patient presents with   Weight Management Screening    HPI Brianna Ball is a 47 y.o. female presenting today for follow up of weight management. Weight management: Weight has remained stable since last visit, which is a huge win given everything that has been going on with her skin biopsy and complications. She has been following a low carb diet prioritizing protein. Exercise routine includes walking typically, though she has not been able to as much since her biopsy was done on her right leg.  She is currently taking phentermine 15 mg daily.  She is still struggling with feeling hungry about an hour after she eats.  Her breakfast typically consists of 2 scrambled eggs and 1 slice of [place] bacon with 8 ounces of water.  She will have fruit and cheese stick as a snack midmorning, and then a salad with grilled chicken for lunch.  She will have another similar snack in the afternoon before having a "good dinner".  She finds that she is often hungry as soon as an hour after eating and struggles with cravings.  ROS Negative unless otherwise noted in HPI   Objective:     BP (!) 144/84 (BP Location: Left Arm, Patient Position: Sitting, Cuff Size: Normal)   Pulse 82   Resp 18   Ht 5\' 1"  (1.549 m)   Wt 158 lb (71.7 kg)   SpO2 96%   BMI 29.85 kg/m   Physical Exam Constitutional:      General: She is not in acute distress.    Appearance: Normal appearance.  HENT:     Head: Normocephalic and atraumatic.  Cardiovascular:     Rate and Rhythm: Normal rate and regular rhythm.     Pulses: Normal pulses.     Heart sounds: No murmur heard.    No friction rub. No gallop.  Pulmonary:     Effort: Pulmonary effort is normal. No respiratory distress.     Breath sounds: No wheezing, rhonchi or rales.  Musculoskeletal:     Cervical back: Normal  range of motion.  Skin:    General: Skin is warm and dry.  Neurological:     General: No focal deficit present.     Mental Status: She is alert and oriented to person, place, and time. Mental status is at baseline.  Psychiatric:        Mood and Affect: Mood normal.        Thought Content: Thought content normal.        Judgment: Judgment normal.     Assessment & Plan:  BMI 28.0-28.9,adult Assessment & Plan: We discussed nutritional goals to keep her satiated including 30-40 g of protein at each meal.  For example, at breakfast of 2 eggs and 1 slice of [place] bacon is about 15 g of protein at best which will not keep her full.  We also discussed the importance of mixing protein with fiber and healthy fats to keep her full.  She is going to work on [employer], mining ideas to work these into each of her meals.  Additionally, given that patient is struggling with cravings/emotional hunger, we discussed adding topiramate to mitigate emotional hunger cues.  Patient is agreeable to this, we would like to delay increasing phentermine due to risk of increasing blood pressure.  Orders: -  Topiramate; Take 1 tablet (25 mg total) by mouth 2 (two) times daily.  Dispense: 30 tablet; Refill: 1 -     Phentermine HCl; Take 1 capsule (15 mg total) by mouth every morning.  Dispense: 30 capsule; Refill: 0  Primary hypertension Assessment & Plan: Blood pressure at goal <140/90, at goal initially 137/85 but on recheck 141/84.  We discussed the importance of needing to continue to monitor blood pressure and heart rate while on phentermine.  Patient verbalized understanding that if she does experience an increase in blood pressure or palpitations at home she will discontinue phentermine immediately.  Will continue to monitor.   Mixed hyperlipidemia -     Rosuvastatin Calcium; Take 1 tablet (5 mg total) by mouth daily.  Dispense: 90 tablet; Refill: 1    Return in about 4 weeks (around 11/04/2022) for follow-up  for weight management.    Melida Quitter, PA

## 2022-10-07 NOTE — Assessment & Plan Note (Signed)
We discussed nutritional goals to keep her satiated including 30-40 g of protein at each meal.  For example, at breakfast of 2 eggs and 1 slice of [place] bacon is about 15 g of protein at best which will not keep her full.  We also discussed the importance of mixing protein with fiber and healthy fats to keep her full.  She is going to work on [employer], mining ideas to work these into each of her meals.  Additionally, given that patient is struggling with cravings/emotional hunger, we discussed adding topiramate to mitigate emotional hunger cues.  Patient is agreeable to this, we would like to delay increasing phentermine due to risk of increasing blood pressure.

## 2022-10-07 NOTE — Assessment & Plan Note (Signed)
>>  ASSESSMENT AND PLAN FOR BMI 28.0-28.9,ADULT WRITTEN ON 10/07/2022 11:20 AM BY Rockie Schnoor A, PA  We discussed nutritional goals to keep her satiated including 30-40 g of protein at each meal.  For example, at breakfast of 2 eggs and 1 slice of [place] bacon is about 15 g of protein at best which will not keep her full.  We also discussed the importance of mixing protein with fiber and healthy fats to keep her full.  She is going to work on [employer], mining ideas to work these into each of her meals.  Additionally, given that patient is struggling with cravings/emotional hunger, we discussed adding topiramate to mitigate emotional hunger cues.  Patient is agreeable to this, we would like to delay increasing phentermine due to risk of increasing blood pressure.

## 2022-10-07 NOTE — Patient Instructions (Signed)
START topiramate 25 mg once daily, can increase to either 25 mg twice daily or 50 mg once daily depending on how it affects cravings.

## 2022-10-14 ENCOUNTER — Other Ambulatory Visit: Payer: Self-pay | Admitting: Family Medicine

## 2022-10-14 DIAGNOSIS — F5104 Psychophysiologic insomnia: Secondary | ICD-10-CM

## 2022-11-06 ENCOUNTER — Encounter: Payer: Self-pay | Admitting: Family Medicine

## 2022-11-06 ENCOUNTER — Ambulatory Visit: Payer: 59 | Admitting: Family Medicine

## 2022-11-06 VITALS — BP 159/86 | HR 64 | Ht 61.0 in | Wt 157.2 lb

## 2022-11-06 DIAGNOSIS — Z6828 Body mass index (BMI) 28.0-28.9, adult: Secondary | ICD-10-CM | POA: Diagnosis not present

## 2022-11-06 DIAGNOSIS — Z85820 Personal history of malignant melanoma of skin: Secondary | ICD-10-CM | POA: Insufficient documentation

## 2022-11-06 MED ORDER — BUPROPION HCL ER (SR) 150 MG PO TB12
150.0000 mg | ORAL_TABLET | ORAL | 1 refills | Status: DC
Start: 2022-11-06 — End: 2022-12-18

## 2022-11-06 NOTE — Assessment & Plan Note (Signed)
>>  ASSESSMENT AND PLAN FOR BMI 28.0-28.9,ADULT WRITTEN ON 11/06/2022  4:07 PM BY Thao Bauza A, PA  Continue phentermine 15 mg daily.  Start bupropion 150 mg daily each morning.  In the future, may increase to either twice daily, 300 mg in the morning, or 150 mg XR.  Will continue to monitor.  Continue prioritizing protein and water and getting physical activity is much as possible.  Continue monitoring blood pressure at home.

## 2022-11-06 NOTE — Progress Notes (Signed)
   Established Patient Office Visit  Subjective   Patient ID: Brianna Ball, female    DOB: Jun 28, 1975  Age: 47 y.o. MRN: [id number]  Chief Complaint  Patient presents with   Weight Management Screening    HPI Brianna Ball is a 47 y.o. female presenting today for follow up of weight management. Weight management: Weight has decreased 1 lbs since last visit. They have been following a low-carb diet prioritizing 30-48 g of protein at each meal. Exercise routine includes walking.  She is currently taking phentermine alone, she tried taking topiramate and developed horrible breast tenderness.  She stopped taking topiramate about a week ago.  She would like to try something else to help with cravings.  She checks her blood pressure at home and it has consistently been under 140/90.  ROS Negative unless otherwise noted in HPI   Objective:     BP (!) 159/86   Pulse 64   Ht 5\' 1"  (1.549 m)   Wt 157 lb 4 oz (71.3 kg)   SpO2 98%   BMI 29.71 kg/m   Physical Exam Constitutional:      General: She is not in acute distress.    Appearance: Normal appearance.  HENT:     Head: Normocephalic and atraumatic.  Cardiovascular:     Rate and Rhythm: Normal rate and regular rhythm.     Heart sounds: No murmur heard.    No friction rub. No gallop.  Pulmonary:     Effort: Pulmonary effort is normal. No respiratory distress.     Breath sounds: No wheezing, rhonchi or rales.  Skin:    General: Skin is warm and dry.  Neurological:     Mental Status: She is alert and oriented to person, place, and time.      Assessment & Plan:  BMI 28.0-28.9,adult Assessment & Plan: Continue phentermine 15 mg daily.  Start bupropion 150 mg daily each morning.  In the future, may increase to either twice daily, 300 mg in the morning, or 150 mg XR.  Will continue to monitor.  Continue prioritizing protein and water and getting physical activity is much as possible.  Continue monitoring blood pressure at  home.  Orders: -     buPROPion HCl ER (SR); Take 1 tablet (150 mg total) by mouth every morning.  Dispense: 60 tablet; Refill: 1    Return in about 6 weeks (around 12/18/2022) for follow-up for weight management, started Wellbutrin.    Melida Quitter, PA

## 2022-11-06 NOTE — Patient Instructions (Signed)
Start with 1/2 tablet every day in the morning for 3-5 days, and if tolerating well then increase to a full tablet each morning.

## 2022-11-06 NOTE — Assessment & Plan Note (Signed)
Continue phentermine 15 mg daily.  Start bupropion 150 mg daily each morning.  In the future, may increase to either twice daily, 300 mg in the morning, or 150 mg XR.  Will continue to monitor.  Continue prioritizing protein and water and getting physical activity is much as possible.  Continue monitoring blood pressure at home.

## 2022-12-09 ENCOUNTER — Other Ambulatory Visit: Payer: Self-pay | Admitting: Family Medicine

## 2022-12-09 DIAGNOSIS — F5104 Psychophysiologic insomnia: Secondary | ICD-10-CM

## 2022-12-18 ENCOUNTER — Ambulatory Visit: Payer: 59 | Admitting: Family Medicine

## 2022-12-18 ENCOUNTER — Encounter: Payer: Self-pay | Admitting: Family Medicine

## 2022-12-18 VITALS — BP 113/74 | HR 97 | Ht 61.0 in | Wt 158.8 lb

## 2022-12-18 DIAGNOSIS — E782 Mixed hyperlipidemia: Secondary | ICD-10-CM | POA: Diagnosis not present

## 2022-12-18 DIAGNOSIS — E669 Obesity, unspecified: Secondary | ICD-10-CM | POA: Diagnosis not present

## 2022-12-18 DIAGNOSIS — M5441 Lumbago with sciatica, right side: Secondary | ICD-10-CM

## 2022-12-18 DIAGNOSIS — Z6830 Body mass index (BMI) 30.0-30.9, adult: Secondary | ICD-10-CM

## 2022-12-18 DIAGNOSIS — G8929 Other chronic pain: Secondary | ICD-10-CM

## 2022-12-18 MED ORDER — PREGABALIN 25 MG PO CAPS
25.0000 mg | ORAL_CAPSULE | Freq: Every day | ORAL | 1 refills | Status: DC
Start: 2022-12-18 — End: 2023-04-01

## 2022-12-18 MED ORDER — TIRZEPATIDE-WEIGHT MANAGEMENT 2.5 MG/0.5ML ~~LOC~~ SOLN
2.5000 mg | SUBCUTANEOUS | 2 refills | Status: AC
Start: 2022-12-18 — End: ?

## 2022-12-18 MED ORDER — LYRICA 25 MG PO CAPS
25.0000 mg | ORAL_CAPSULE | Freq: Every day | ORAL | 1 refills | Status: DC
Start: 2022-12-18 — End: 2022-12-18

## 2022-12-18 MED ORDER — ROSUVASTATIN CALCIUM 5 MG PO TABS
5.0000 mg | ORAL_TABLET | Freq: Every day | ORAL | 1 refills | Status: DC
Start: 2022-12-18 — End: 2023-06-29

## 2022-12-18 NOTE — Assessment & Plan Note (Signed)
Discontinue phentermine and bupropion.  Sent prescription for Evaristo Bury tied vial to [facility].  Directed patient to discuss with the pharmacy the appropriate syringe and needle once she receives the medication, and at that time I can send in the prescription for those.  Continue prioritizing protein and water, continue walking routine.

## 2022-12-18 NOTE — Progress Notes (Signed)
   Established Patient Office Visit  Subjective   Patient ID: Brianna Ball, female    DOB: 06-15-1975  Age: 47 y.o. MRN: [id number]  Chief Complaint  Patient presents with   Weight Check    HPI Brianna Ball is a 47 y.o. female presenting today for follow up of weight management.  She also has complaints of hot flashes that have become bothersome enough that she would like to discuss options for management. Weight management: Weight has increased 1 lbs since last visit.  following a low-carb diet prioritizing 30-48 g of protein at each meal. Exercise routine includes walking.  She is currently taking phentermine and Wellbutrin, previously she tried taking topiramate and developed horrible breast tenderness.  The Wellbutrin has increased her anxiety significantly, so she stopped taking it this morning.  She did see that [facility] is offering vials of Zepbound at a discounted rate and would like to start this program.    ROS Negative unless otherwise noted in HPI   Objective:     BP 113/74   Pulse 97   Ht 5\' 1"  (1.549 m)   Wt 158 lb 12.8 oz (72 kg)   SpO2 97%   BMI 30.00 kg/m   Physical Exam Constitutional:      General: She is not in acute distress.    Appearance: Normal appearance.  HENT:     Head: Normocephalic and atraumatic.  Cardiovascular:     Rate and Rhythm: Normal rate and regular rhythm.     Heart sounds: No murmur heard.    No friction rub. No gallop.  Pulmonary:     Effort: Pulmonary effort is normal. No respiratory distress.     Breath sounds: No wheezing, rhonchi or rales.  Skin:    General: Skin is warm and dry.  Neurological:     Mental Status: She is alert and oriented to person, place, and time.     Assessment & Plan:  Class 1 obesity with body mass index (BMI) of 30.0 to 30.9 in adult, unspecified obesity type, unspecified whether serious comorbidity present Assessment & Plan: Discontinue phentermine and bupropion.  Sent prescription for Evaristo Bury  tied vial to [facility].  Directed patient to discuss with the pharmacy the appropriate syringe and needle once she receives the medication, and at that time I can send in the prescription for those.  Continue prioritizing protein and water, continue walking routine.  Orders: -     Tirzepatide-Weight Management; Inject 2.5 mg into the skin once a week.  Dispense: 2 mL; Refill: 2  Mixed hyperlipidemia -     Rosuvastatin Calcium; Take 1 tablet (5 mg total) by mouth daily.  Dispense: 90 tablet; Refill: 1  Chronic midline low back pain with right-sided sciatica -     Lyrica; Take 1 capsule (25 mg total) by mouth daily.  Dispense: 30 capsule; Refill: 1    Return in about 2 months (around 02/17/2023) for follow-up for weight management.    Melida Quitter, PA

## 2022-12-18 NOTE — Patient Instructions (Signed)
Once you get the vial of Zepbound in the mail, please go to your pharmacy and ask which syringes and needles should be prescribed in order for you to give yourself the injections. They can then send a request for that prescription for me to sign.   They should also be able to instruct you on how to draw up the medication and give yourself and injection since the program is for the vial and not the auto-inject pens.

## 2022-12-18 NOTE — Addendum Note (Signed)
Addended by: Saralyn Pilar on: 12/18/2022 11:06 AM   Modules accepted: Orders

## 2022-12-23 ENCOUNTER — Encounter: Payer: Self-pay | Admitting: Family Medicine

## 2023-01-01 ENCOUNTER — Encounter: Payer: Self-pay | Admitting: Family Medicine

## 2023-01-01 DIAGNOSIS — E6609 Other obesity due to excess calories: Secondary | ICD-10-CM

## 2023-01-08 MED ORDER — SYRINGE (DISPOSABLE) 1 ML MISC: 1 refills | Status: DC

## 2023-01-08 NOTE — Addendum Note (Signed)
Addended by: Saralyn Pilar on: 01/08/2023 08:10 AM   Modules accepted: Orders

## 2023-01-12 ENCOUNTER — Other Ambulatory Visit: Payer: Self-pay | Admitting: Family Medicine

## 2023-01-12 DIAGNOSIS — Z6830 Body mass index (BMI) 30.0-30.9, adult: Secondary | ICD-10-CM

## 2023-01-26 ENCOUNTER — Encounter: Payer: Self-pay | Admitting: Family Medicine

## 2023-01-26 DIAGNOSIS — E6609 Other obesity due to excess calories: Secondary | ICD-10-CM

## 2023-01-26 MED ORDER — TIRZEPATIDE-WEIGHT MANAGEMENT 5 MG/0.5ML ~~LOC~~ SOLN: 5.0000 mg | SUBCUTANEOUS | 3 refills | Status: DC

## 2023-02-12 ENCOUNTER — Other Ambulatory Visit: Payer: Self-pay | Admitting: Family Medicine

## 2023-02-12 DIAGNOSIS — F5104 Psychophysiologic insomnia: Secondary | ICD-10-CM

## 2023-02-17 ENCOUNTER — Ambulatory Visit: Payer: 59 | Admitting: Family Medicine

## 2023-02-17 ENCOUNTER — Encounter: Payer: Self-pay | Admitting: Family Medicine

## 2023-02-17 VITALS — BP 120/83 | HR 71 | Resp 18 | Ht 61.0 in | Wt 148.0 lb

## 2023-02-17 DIAGNOSIS — Z6830 Body mass index (BMI) 30.0-30.9, adult: Secondary | ICD-10-CM | POA: Diagnosis not present

## 2023-02-17 DIAGNOSIS — E6609 Other obesity due to excess calories: Secondary | ICD-10-CM

## 2023-02-17 DIAGNOSIS — E66811 Obesity, class 1: Secondary | ICD-10-CM | POA: Diagnosis not present

## 2023-02-17 NOTE — Progress Notes (Signed)
   Established Patient Office Visit  Subjective   Patient ID: Brianna Ball, female    DOB: [birth year]  Age: 47 y.o. MRN: [id number]  No chief complaint on file.   HPI Brianna Ball is a 47 y.o. female presenting today for follow up of weight management. Weight management: Weight has decreased 11 lbs since last visit. They have been following a high protein diet. Exercise routine includes walking.  She is currently taking tirzepetide injections through [facility], denies side effects.  She states the process has been very easy, [facility] has an option to include syringes and needles with the tirzepatide vials.    Outpatient Medications Prior to Visit  Medication Sig   ibuprofen (ADVIL) 600 MG tablet Take 600 mg by mouth every 6 (six) hours as needed.   mirtazapine (REMERON) 15 MG tablet TAKE 1/2 TO 1 TABLET BY MOUTH NIGHTLY AT BEDTIME.   pregabalin (LYRICA) 25 MG capsule Take 1 capsule (25 mg total) by mouth daily.   rosuvastatin (CRESTOR) 5 MG tablet Take 1 tablet (5 mg total) by mouth daily.   Syringe, Disposable, 1 ML MISC Use one syringe for Zepbound injection once weekly.   tirzepatide 5 MG/0.5ML injection vial Inject 5 mg into the skin once a week.   valsartan (DIOVAN) 160 MG tablet Take 1 tablet (160 mg total) by mouth daily.   No facility-administered medications prior to visit.    ROS Negative unless otherwise noted in HPI   Objective:     BP 120/83 (BP Location: Left Arm, Patient Position: Sitting, Cuff Size: Normal)   Pulse 71   Resp 18   Ht 5\' 1"  (1.549 m)   Wt 148 lb (67.1 kg)   SpO2 99%   BMI 27.96 kg/m   Physical Exam Constitutional:      General: She is not in acute distress.    Appearance: Normal appearance.  HENT:     Head: Normocephalic and atraumatic.  Cardiovascular:     Rate and Rhythm: Normal rate and regular rhythm.     Heart sounds: No murmur heard.    No friction rub. No gallop.  Pulmonary:     Effort: Pulmonary effort is normal. No  respiratory distress.     Breath sounds: No wheezing, rhonchi or rales.  Skin:    General: Skin is warm and dry.  Neurological:     Mental Status: She is alert and oriented to person, place, and time.     Assessment & Plan:  Class 1 obesity due to excess calories with serious comorbidity and body mass index (BMI) of 30.0 to 30.9 in adult Assessment & Plan: Starting weight: 159 lbs Current weight: 148 lbs Weight loss: -11 lbs total (6.9% starting weight)  Continue tirzepatide injections weekly.  Reiterated the importance of prioritizing protein to maintain muscle mass.  Will continue to monitor.     Return in about 2 months (around 04/19/2023) for follow-up for weight management, in person or video.  Next appointment after that will be in March for annual physical.  If doing well and stable at that point, can start weight management follow-ups every 4-6 months.   Melida Quitter, PA

## 2023-02-17 NOTE — Assessment & Plan Note (Signed)
Starting weight: 159 lbs Current weight: 148 lbs Weight loss: -11 lbs total (6.9% starting weight)  Continue tirzepatide injections weekly.  Reiterated the importance of prioritizing protein to maintain muscle mass.  Will continue to monitor.

## 2023-02-19 ENCOUNTER — Encounter: Payer: Self-pay | Admitting: Family Medicine

## 2023-02-19 DIAGNOSIS — E6609 Other obesity due to excess calories: Secondary | ICD-10-CM

## 2023-02-20 MED ORDER — TIRZEPATIDE-WEIGHT MANAGEMENT 2.5 MG/0.5ML ~~LOC~~ SOLN
2.5000 mg | SUBCUTANEOUS | 3 refills | Status: DC
Start: 2023-02-20 — End: 2023-04-20

## 2023-04-01 ENCOUNTER — Other Ambulatory Visit: Payer: Self-pay | Admitting: Family Medicine

## 2023-04-01 DIAGNOSIS — G8929 Other chronic pain: Secondary | ICD-10-CM

## 2023-04-16 ENCOUNTER — Other Ambulatory Visit: Payer: Self-pay | Admitting: Family Medicine

## 2023-04-16 DIAGNOSIS — F5104 Psychophysiologic insomnia: Secondary | ICD-10-CM

## 2023-04-20 ENCOUNTER — Encounter: Payer: Self-pay | Admitting: Family Medicine

## 2023-04-20 ENCOUNTER — Ambulatory Visit: Payer: 59 | Admitting: Family Medicine

## 2023-04-20 VITALS — BP 104/66 | HR 78 | Temp 98.9°F | Ht 61.0 in | Wt 135.8 lb

## 2023-04-20 DIAGNOSIS — Z6830 Body mass index (BMI) 30.0-30.9, adult: Secondary | ICD-10-CM | POA: Diagnosis not present

## 2023-04-20 DIAGNOSIS — I10 Essential (primary) hypertension: Secondary | ICD-10-CM | POA: Diagnosis not present

## 2023-04-20 DIAGNOSIS — E66811 Obesity, class 1: Secondary | ICD-10-CM

## 2023-04-20 DIAGNOSIS — E6609 Other obesity due to excess calories: Secondary | ICD-10-CM | POA: Diagnosis not present

## 2023-04-20 MED ORDER — TIRZEPATIDE-WEIGHT MANAGEMENT 2.5 MG/0.5ML ~~LOC~~ SOLN: 2.5000 mg | SUBCUTANEOUS | 3 refills | Status: DC

## 2023-04-20 NOTE — Progress Notes (Signed)
 Established Patient Office Visit  Subjective   Patient ID: Brianna Ball, female    DOB: 1976-04-14  Age: 48 y.o. MRN: [id number]  Chief Complaint  Patient presents with   Weight Management Screening    HPI Brianna Ball is a 48 y.o. female presenting today for follow up of weight management. Weight has decreased 13 lbs since last visit.  She has been following a high-protein diet and walking routinely.  She is currently taking tirzepetide injections through [facility], denies side effects.  [facility] continues to be very easy to work with and very timely and getting refills to her.  Outpatient Medications Prior to Visit  Medication Sig   ibuprofen (ADVIL) 600 MG tablet Take 600 mg by mouth every 6 (six) hours as needed.   mirtazapine  (REMERON ) 15 MG tablet TAKE 1/2 TO 1 TABLET BY MOUTH NIGHTLY AT BEDTIME.   pregabalin  (LYRICA ) 25 MG capsule TAKE 1 CAPSULE (25 MG TOTAL) BY MOUTH DAILY.   rosuvastatin  (CRESTOR ) 5 MG tablet Take 1 tablet (5 mg total) by mouth daily.   Syringe, Disposable, 1 ML MISC Use one syringe for Zepbound  injection once weekly.   valsartan  (DIOVAN ) 160 MG tablet Take 1 tablet (160 mg total) by mouth daily.   [DISCONTINUED] tirzepatide  (ZEPBOUND ) 2.5 MG/0.5ML injection vial Inject 2.5 mg into the skin once a week.   No facility-administered medications prior to visit.    ROS Negative unless otherwise noted in HPI   Objective:     BP 104/66   Pulse 78   Temp 98.9 F (37.2 C) (Oral)   Ht 5' 1 (1.549 m)   Wt 135 lb 12 oz (61.6 kg)   SpO2 100%   BMI 25.65 kg/m   Physical Exam Constitutional:      General: She is not in acute distress.    Appearance: Normal appearance.  HENT:     Head: Normocephalic and atraumatic.  Pulmonary:     Effort: Pulmonary effort is normal. No respiratory distress.  Musculoskeletal:     Cervical back: Normal range of motion.  Neurological:     General: No focal deficit present.     Mental Status: She is alert and  oriented to person, place, and time. Mental status is at baseline.  Psychiatric:        Mood and Affect: Mood normal.        Thought Content: Thought content normal.        Judgment: Judgment normal.      Assessment & Plan:  Class 1 obesity due to excess calories with serious comorbidity and body mass index (BMI) of 30.0 to 30.9 in adult Assessment & Plan: Starting weight: 159 lbs Current weight: 135 lbs Weight loss: -24 lbs total (15.1% starting weight)  Continue tirzepatide  injections weekly.  Continue prioritizing protein to maintain muscle mass.  Will continue to monitor.  After annual physical, stretch out follow-up visits to every 4-6 months since she is stable and tolerating medication well.  Orders: -     Tirzepatide -Weight Management; Inject 2.5 mg into the skin once a week.  Dispense: 2 mL; Refill: 3  Primary hypertension Assessment & Plan: Blood pressure at goal <140/90.  Blood pressure on the lower end today at 104/66.  If blood pressure remains in this range at her annual physical, recommend decreasing dose of valsartan  to 80 mg daily.  Will continue to monitor.     Return in about 2 months (around 06/26/2023) for annual physical, fasting blood work  1 week before.    Joesph DELENA Sear, PA

## 2023-04-20 NOTE — Assessment & Plan Note (Addendum)
 Starting weight: 159 lbs Current weight: 135 lbs Weight loss: -24 lbs total (15.1% starting weight)  Continue tirzepatide  injections weekly.  Continue prioritizing protein to maintain muscle mass.  Will continue to monitor.  After annual physical, stretch out follow-up visits to every 4-6 months since she is stable and tolerating medication well.

## 2023-04-20 NOTE — Assessment & Plan Note (Signed)
 Blood pressure at goal <140/90.  Blood pressure on the lower end today at 104/66.  If blood pressure remains in this range at her annual physical, recommend decreasing dose of valsartan to 80 mg daily.  Will continue to monitor.

## 2023-05-21 ENCOUNTER — Encounter: Payer: Self-pay | Admitting: Family Medicine

## 2023-05-25 ENCOUNTER — Encounter: Payer: Self-pay | Admitting: Family Medicine

## 2023-05-28 ENCOUNTER — Other Ambulatory Visit: Payer: Self-pay

## 2023-05-28 DIAGNOSIS — Z0000 Encounter for general adult medical examination without abnormal findings: Secondary | ICD-10-CM

## 2023-05-28 DIAGNOSIS — Z136 Encounter for screening for cardiovascular disorders: Secondary | ICD-10-CM

## 2023-06-01 ENCOUNTER — Other Ambulatory Visit: Payer: Self-pay | Admitting: Family Medicine

## 2023-06-01 DIAGNOSIS — G8929 Other chronic pain: Secondary | ICD-10-CM

## 2023-06-01 DIAGNOSIS — F5104 Psychophysiologic insomnia: Secondary | ICD-10-CM

## 2023-06-09 ENCOUNTER — Other Ambulatory Visit: Payer: No Typology Code available for payment source

## 2023-06-09 DIAGNOSIS — Z0000 Encounter for general adult medical examination without abnormal findings: Secondary | ICD-10-CM

## 2023-06-09 DIAGNOSIS — Z136 Encounter for screening for cardiovascular disorders: Secondary | ICD-10-CM

## 2023-06-10 ENCOUNTER — Encounter: Payer: Self-pay | Admitting: Family Medicine

## 2023-06-10 LAB — LIPID PANEL
Chol/HDL Ratio: 2.3 ratio (ref 0.0–4.4)
Cholesterol, Total: 171 mg/dL (ref 100–199)
HDL: 73 mg/dL (ref >39)
LDL Chol Calc (NIH): 85 mg/dL (ref 0–99)
Triglycerides: 65 mg/dL (ref 0–149)
VLDL Cholesterol Cal: 13 mg/dL (ref 5–40)

## 2023-06-10 LAB — COMPREHENSIVE METABOLIC PANEL
ALT: 13 IU/L (ref 0–32)
AST: 15 IU/L (ref 0–40)
Albumin: 4.7 g/dL (ref 3.9–4.9)
Alkaline Phosphatase: 71 IU/L (ref 44–121)
BUN/Creatinine Ratio: 12 (ref 9–23)
BUN: 11 mg/dL (ref 6–24)
Bilirubin Total: 0.5 mg/dL (ref 0.0–1.2)
CO2: 22 mmol/L (ref 20–29)
Calcium: 9.7 mg/dL (ref 8.7–10.2)
Chloride: 106 mmol/L (ref 96–106)
Creatinine, Ser: 0.93 mg/dL (ref 0.57–1.00)
Globulin, Total: 2.2 g/dL (ref 1.5–4.5)
Glucose: 81 mg/dL (ref 70–99)
Potassium: 4.4 mmol/L (ref 3.5–5.2)
Sodium: 143 mmol/L (ref 134–144)
Total Protein: 6.9 g/dL (ref 6.0–8.5)
eGFR: 75 mL/min/{1.73_m2} (ref >59)

## 2023-06-10 LAB — CBC WITH DIFFERENTIAL/PLATELET
Basophils Absolute: 0 10*3/uL (ref 0.0–0.2)
Basos: 1 %
EOS (ABSOLUTE): 0.1 10*3/uL (ref 0.0–0.4)
Eos: 2 %
Hematocrit: 40.7 % (ref 34.0–46.6)
Hemoglobin: 13.5 g/dL (ref 11.1–15.9)
Immature Grans (Abs): 0 10*3/uL (ref 0.0–0.1)
Immature Granulocytes: 0 %
Lymphocytes Absolute: 2.8 10*3/uL (ref 0.7–3.1)
Lymphs: 46 %
MCH: 30 pg (ref 26.6–33.0)
MCHC: 33.2 g/dL (ref 31.5–35.7)
MCV: 90 fL (ref 79–97)
Monocytes Absolute: 0.5 10*3/uL (ref 0.1–0.9)
Monocytes: 8 %
Neutrophils Absolute: 2.6 10*3/uL (ref 1.4–7.0)
Neutrophils: 43 %
Platelets: 293 10*3/uL (ref 150–450)
RBC: 4.5 x10E6/uL (ref 3.77–5.28)
RDW: 12.3 % (ref 11.7–15.4)
WBC: 6 10*3/uL (ref 3.4–10.8)

## 2023-06-10 LAB — HEMOGLOBIN A1C
Est. average glucose Bld gHb Est-mCnc: 100 mg/dL
Hgb A1c MFr Bld: 5.1 % (ref 4.8–5.6)

## 2023-06-12 ENCOUNTER — Other Ambulatory Visit: Payer: Self-pay | Admitting: Family Medicine

## 2023-06-12 DIAGNOSIS — E66811 Obesity, class 1: Secondary | ICD-10-CM

## 2023-06-18 ENCOUNTER — Encounter: Payer: 59 | Admitting: Family Medicine

## 2023-06-18 ENCOUNTER — Telehealth: Payer: Self-pay | Admitting: Family Medicine

## 2023-06-18 DIAGNOSIS — E66811 Obesity, class 1: Secondary | ICD-10-CM

## 2023-06-18 MED ORDER — SYRINGE (DISPOSABLE) 1 ML MISC: 5 refills | Status: AC

## 2023-06-18 NOTE — Progress Notes (Deleted)
 Complete physical exam  Patient: Brianna Ball   DOB: 1975/09/18   48 y.o. Female  MRN: [id number]  Subjective:    No chief complaint on file.   Brianna Ball is a 48 y.o. female who presents today for a complete physical exam. She reports consuming a {diet types:17450} diet. {types:19826} She generally feels {DESC; WELL/FAIRLY WELL/POORLY:18703}. She reports sleeping {DESC; WELL/FAIRLY WELL/POORLY:18703}. She {does/does not:200015} have additional problems to discuss today.    Most recent fall risk assessment:    04/20/2023   10:12 AM  Fall Risk   Falls in the past year? 0  Number falls in past yr: 0  Injury with Fall? 0  Risk for fall due to : No Fall Risks  Follow up Falls evaluation completed     Most recent depression and anxiety screenings:    04/20/2023   10:13 AM 02/17/2023    9:43 AM  PHQ 2/9 Scores  PHQ - 2 Score 0 0  PHQ- 9 Score 2 4      04/20/2023   10:13 AM 02/17/2023    9:44 AM 11/06/2022    3:41 PM 09/09/2022   10:15 AM  GAD 7 : Generalized Anxiety Score  Nervous, Anxious, on Edge 1 1 1 2   Control/stop worrying 1 1 1 1   Worry too much - different things 1 1 1 1   Trouble relaxing 1 1 1 1   Restless 1 1 1 1   Easily annoyed or irritable 1 1 0 1  Afraid - awful might happen 1 1 1    Total GAD 7 Score 7 7 6    Anxiety Difficulty Not difficult at all Not difficult at all Not difficult at all Not difficult at all    Patient Active Problem List   Diagnosis Date Noted   History of melanoma 11/06/2022   Palpitations 08/05/2022   Mixed hyperlipidemia 07/08/2022   Class 1 obesity with body mass index (BMI) of 30.0 to 30.9 in adult 07/08/2022   Psychophysiological insomnia 06/23/2022   Primary hypertension 06/23/2022   S/P lumbar fusion 02/06/2020   Chronic back pain 01/31/2020   Vitamin D insufficiency 12/05/2015   Radicular pain 11/02/2015   Neck pain 07/14/2014   b/l fibrocystic breast dx 08/19/2007   h/o Adjustment disorder with mixed anxiety and  depressed mood 08/18/2007   History of hysterectomy for benign disease (DUB) 11/01/2000    Past Surgical History:  Procedure Laterality Date   ABDOMINAL EXPOSURE N/A 02/06/2020   Procedure: ABDOMINAL EXPOSURE;  Surgeon: Cephus Shelling, MD;  [facility];  Service: Vascular;  Laterality: N/A;   ABDOMINAL HYSTERECTOMY  2007   ANTERIOR LUMBAR FUSION N/A 02/06/2020   Procedure: Lumbar Five -Sacral One Anterior Lumbar Interbody Fusion;  Surgeon: Tia Alert, MD;  Location: [facility] OR;  Service: Neurosurgery;  Laterality: N/A;   CERVICAL DISC SURGERY  2016   MICRODISCECTOMY LUMBAR  2020   MICRODISCECTOMY LUMBAR  2021   TOTAL ABDOMINAL HYSTERECTOMY Bilateral 2007   TUBAL LIGATION  2000   Social History   Tobacco Use   Smoking status: Never    Passive exposure: Never   Smokeless tobacco: Never  Vaping Use   Vaping status: Never Used  Substance Use Topics   Alcohol use: No   Drug use: No   Family History  Problem Relation Age of Onset   Colon polyps Mother    Hypertension Mother    Colitis Neg Hx    Esophageal cancer Neg Hx    Rectal  cancer Neg Hx    Stomach cancer Neg Hx    Allergies  Allergen Reactions   Codeine Itching     Patient Care Team: Melida Quitter, PA as PCP - General (Family Medicine) [facility], [facility], Thomes Dinning, MD as Consulting Physician (Neurosurgery)   Outpatient Medications Prior to Visit  Medication Sig   ibuprofen (ADVIL) 600 MG tablet Take 600 mg by mouth every 6 (six) hours as needed.   mirtazapine (REMERON) 15 MG tablet TAKE 1/2 TO 1 TABLET BY MOUTH NIGHTLY AT BEDTIME.   pregabalin (LYRICA) 25 MG capsule TAKE 1 CAPSULE (25 MG TOTAL) BY MOUTH DAILY.   rosuvastatin (CRESTOR) 5 MG tablet Take 1 tablet (5 mg total) by mouth daily.   Syringe, Disposable, 1 ML MISC Use one syringe for Zepbound injection once weekly.   valsartan (DIOVAN) 160 MG tablet Take 1 tablet (160 mg total) by mouth daily.   ZEPBOUND 2.5  MG/0.5ML injection vial INJECT 0.5 ML (2.5 MG) UNDER THE SKIN ONCE WEEKLY   No facility-administered medications prior to visit.    ROS    Objective:    There were no vitals taken for this visit.   Physical Exam     Assessment & Plan:    Routine Health Maintenance and Physical Exam  Immunization History  Administered Date(s) Administered   PFIZER(Purple Top)SARS-COV-2 Vaccination 12/19/2019   Tdap 11/02/2015    Health Maintenance  Topic Date Due   COVID-19 Vaccine (2 - 2024-25 season) 12/14/2022   MAMMOGRAM  05/03/2023   Zoster Vaccines- Shingrix (1 of 2) Never done   INFLUENZA VACCINE  07/13/2023 (Originally 11/13/2022)   DTaP/Tdap/Td (2 - Td or Tdap) 11/01/2025   Colonoscopy  08/18/2032   Hepatitis C Screening  Completed   HIV Screening  Completed   HPV VACCINES  Aged Out    Reviewed most recent labs including CBC, CMP, lipid panel, A1C, TSH, and vitamin D. All within normal limits/stable from last check other than ***. Discussed health benefits of physical activity, and encouraged her to engage in regular exercise appropriate for her age and condition.  There are no diagnoses linked to this encounter.  No follow-ups on file.     Melida Quitter, PA

## 2023-06-18 NOTE — Telephone Encounter (Signed)
 Patient needs refill called into [facility]. Please refill until she is scheduled with [place] in June.   Syringe, Disposable, 1 ML MISC [[id number]]   Order Details Dose, Route, Frequency: As Directed  Dispense Quantity: 25 each Refills: 1        Sig: Use one syringe for Zepbound injection once weekly.       Start Date: 01/08/23 End Date: --  Written Date: 01/08/23 Expiration Date: 01/08/24     Associated Diagnoses: Class 1 obesity due to excess calories with body mass index (BMI) of 30.0 to 30.9 in adult, unspecified whether serious comorbidity present [E66.09, Z68.30]     Order Questions  Question Answer Comment  Supervising Provider Nani Gasser D       Providers  Authorizing Provider: Melida Quitter, PA [address] [place], [place] [address] Phone: [phone]   Fax: [phone] DEA #: [id number]   NPI: [id number] Supervising Provider: Agapito Games, MD [address], [place] [address] Phone: [phone]   Fax: [phone] DEA #: [id number]   NPI: [id number]     Ordering User: Melida Quitter, PA        Pharmacy  [facility] - [place], [place] - [address] [place] [address] Phone: [phone]  Fax: [phone] DEA #: --  DAW Reason: --       Order Class  Normal   All Administrations of Syringe, Disposable, 1 ML MISC   suggestion  The administrations shown are only for this specific order and not for other orders for the same medication that may be in this encounter.   No Administrations Recorded               Med Administrations and Associated Flowsheet Values (last 96 hours)  None  Warnings Override History  No Interaction Warnings Shown      Order History Outpatient Date/Time Action Taken User Additional Information  01/08/23 0810 Sign Melida Quitter, [place]   02/17/23 6270 Taking Flag Checked [place], Clois Comber, CMA        Tracking  Links  Cosign Tracking Order [employer]

## 2023-06-18 NOTE — Telephone Encounter (Signed)
 Meds ordered this encounter  Medications   Syringe, Disposable, 1 ML MISC    Sig: Use one syringe for Zepbound injection once weekly.    Dispense:  25 each    Refill:  5    Supervising Provider:   Sandre Kitty [[id number]]

## 2023-06-29 ENCOUNTER — Other Ambulatory Visit: Payer: Self-pay | Admitting: Family Medicine

## 2023-06-29 DIAGNOSIS — E782 Mixed hyperlipidemia: Secondary | ICD-10-CM

## 2023-08-07 ENCOUNTER — Other Ambulatory Visit: Payer: Self-pay | Admitting: Family Medicine

## 2023-08-07 DIAGNOSIS — I10 Essential (primary) hypertension: Secondary | ICD-10-CM

## 2023-08-11 ENCOUNTER — Other Ambulatory Visit: Payer: Self-pay | Admitting: Family Medicine

## 2023-08-11 DIAGNOSIS — I10 Essential (primary) hypertension: Secondary | ICD-10-CM

## 2023-08-11 MED ORDER — VALSARTAN 160 MG PO TABS: 160.0000 mg | ORAL_TABLET | Freq: Every day | ORAL | 1 refills | Status: DC

## 2023-08-11 NOTE — Telephone Encounter (Signed)
 Copied from CRM [id number]. Topic: Clinical - Medication Refill >> Aug 11, 2023  9:38 AM Brianna Ball wrote: Most Recent Primary Care Visit:  Provider: [facility]  Department: [facility]  Visit Type: LAB VISIT  Date: 06/09/2023  Medication: valsartan  (DIOVAN ) 160 MG tablet  Has the patient contacted their pharmacy? Yes (Agent: If no, request that the patient contact the pharmacy for the refill. If patient does not wish to contact the pharmacy document the reason why and proceed with request.) (Agent: If yes, when and what did the pharmacy advise?)  Is this the correct pharmacy for this prescription? Yes If no, delete pharmacy and type the correct one.  This is the patient's preferred pharmacy:  [facility] - [place], [place] - [address] [place] [address] Phone: [phone] Fax: [phone]       Has the prescription been filled recently? Yes  Is the patient out of the medication? Yes  Has the patient been seen for an appointment in the last year OR does the patient have an upcoming appointment? Yes  Can we respond through MyChart? No  Agent: Please be advised that Rx refills may take up to 3 business days. We ask that you follow-up with your pharmacy.

## 2023-08-11 NOTE — Telephone Encounter (Signed)
Patient needs appointment for next refill

## 2023-09-10 ENCOUNTER — Other Ambulatory Visit: Payer: Self-pay | Admitting: Family Medicine

## 2023-09-10 DIAGNOSIS — G8929 Other chronic pain: Secondary | ICD-10-CM

## 2023-09-10 DIAGNOSIS — F5104 Psychophysiologic insomnia: Secondary | ICD-10-CM

## 2023-11-06 ENCOUNTER — Other Ambulatory Visit: Payer: Self-pay | Admitting: Family Medicine

## 2023-11-06 DIAGNOSIS — E66811 Obesity, class 1: Secondary | ICD-10-CM

## 2023-12-11 ENCOUNTER — Other Ambulatory Visit: Payer: Self-pay | Admitting: Family Medicine

## 2023-12-11 DIAGNOSIS — G8929 Other chronic pain: Secondary | ICD-10-CM

## 2023-12-11 DIAGNOSIS — F5104 Psychophysiologic insomnia: Secondary | ICD-10-CM

## 2023-12-15 NOTE — Telephone Encounter (Signed)
 LVM and sent [facility] message to schedule an appt.

## 2024-01-27 ENCOUNTER — Encounter

## 2024-01-29 ENCOUNTER — Other Ambulatory Visit: Payer: Self-pay | Admitting: Family Medicine

## 2024-01-29 DIAGNOSIS — I10 Essential (primary) hypertension: Secondary | ICD-10-CM

## 2024-02-22 ENCOUNTER — Encounter

## 2024-03-02 ENCOUNTER — Other Ambulatory Visit: Payer: Self-pay | Admitting: Family Medicine

## 2024-03-02 DIAGNOSIS — F5104 Psychophysiologic insomnia: Secondary | ICD-10-CM

## 2024-03-23 ENCOUNTER — Other Ambulatory Visit: Payer: Self-pay | Admitting: Family Medicine

## 2024-03-23 DIAGNOSIS — E782 Mixed hyperlipidemia: Secondary | ICD-10-CM

## 2024-03-23 DIAGNOSIS — G8929 Other chronic pain: Secondary | ICD-10-CM

## 2024-04-01 ENCOUNTER — Encounter

## 2024-04-04 ENCOUNTER — Encounter

## 2024-04-04 VITALS — BP 124/75 | HR 61 | Temp 98.2°F | Ht 61.0 in | Wt 118.0 lb

## 2024-04-04 DIAGNOSIS — Z130 Encounter for screening for diseases of the blood and blood-forming organs and certain disorders involving the immune mechanism: Secondary | ICD-10-CM | POA: Diagnosis not present

## 2024-04-04 DIAGNOSIS — Z1329 Encounter for screening for other suspected endocrine disorder: Secondary | ICD-10-CM | POA: Diagnosis not present

## 2024-04-04 DIAGNOSIS — I10 Essential (primary) hypertension: Secondary | ICD-10-CM

## 2024-04-04 DIAGNOSIS — E66811 Obesity, class 1: Secondary | ICD-10-CM | POA: Diagnosis not present

## 2024-04-04 DIAGNOSIS — Z13228 Encounter for screening for other metabolic disorders: Secondary | ICD-10-CM

## 2024-04-04 DIAGNOSIS — Z6830 Body mass index (BMI) 30.0-30.9, adult: Secondary | ICD-10-CM | POA: Diagnosis not present

## 2024-04-04 DIAGNOSIS — M5410 Radiculopathy, site unspecified: Secondary | ICD-10-CM | POA: Diagnosis not present

## 2024-04-04 DIAGNOSIS — E559 Vitamin D deficiency, unspecified: Secondary | ICD-10-CM | POA: Diagnosis not present

## 2024-04-04 DIAGNOSIS — E782 Mixed hyperlipidemia: Secondary | ICD-10-CM

## 2024-04-04 DIAGNOSIS — F411 Generalized anxiety disorder: Secondary | ICD-10-CM | POA: Diagnosis not present

## 2024-04-04 DIAGNOSIS — Z0000 Encounter for general adult medical examination without abnormal findings: Secondary | ICD-10-CM | POA: Diagnosis not present

## 2024-04-04 DIAGNOSIS — F5104 Psychophysiologic insomnia: Secondary | ICD-10-CM | POA: Diagnosis not present

## 2024-04-04 MED ORDER — ZEPBOUND 2.5 MG/0.5ML ~~LOC~~ SOLN: 2.5000 mg | SUBCUTANEOUS | 3 refills | Status: DC

## 2024-04-04 MED ORDER — "INSULIN SYRINGE 31G X 5/16"" 1 ML MISC": 0 refills | Status: DC

## 2024-04-04 NOTE — Patient Instructions (Signed)
 VISIT SUMMARY: Today, you had your annual physical exam. We discussed your current medications, chronic pain, anxiety, and preventive health maintenance. We also reviewed your management plan for obesity, hyperlipidemia, and hypertension.  YOUR PLAN: OBESITY: You are managing your weight with Zepbound . -We resent your Zepbound  prescription to [facility]. -We ordered 21 gauge needles for your Zepbound  administration.  HYPERLIPIDEMIA: Your cholesterol levels are well-controlled with rosuvastatin . -Continue taking rosuvastatin  5 mg daily. -We ordered a fasting lipid panel.  HYPERTENSION: Your blood pressure is well-controlled with valsartan . -Continue taking valsartan  as prescribed.  CHRONIC NEUROPATHIC PAIN: You have nerve pain in your legs due to previous back surgeries. -Continue taking pregabalin  at bedtime.  DEPRESSION AND ANXIETY: Your anxiety and sleep issues are managed with mirtazapine . -Continue taking mirtazapine  at bedtime. -Monitor your anxiety levels and we will adjust treatment if necessary.  GENERAL HEALTH MAINTENANCE: You are due for some routine health maintenance. -We ordered fasting blood work including A1c, vitamin D , and thyroid function tests. -Please schedule your Pap smear and mammogram. -We discussed pneumonia and shingles vaccines; please decide if you would like to receive them.  If you have any problems before your next visit feel free to message me via MyChart (minor issues or questions) or call the office, otherwise you may reach out to schedule an office visit.  Thank you! Saddie Sacks, PA-C

## 2024-04-04 NOTE — Assessment & Plan Note (Signed)
 SW: 159 lbs  Current weight: 118 lbs   Patient is using Zepbound  2.5 mg once a month to help with weight maintenance as she has reached her weight loss goal and does not desire to lose more weight. Refill sent to [facility].

## 2024-04-04 NOTE — Assessment & Plan Note (Signed)
 Rechecking vit D with labs. Will supplement as indicated

## 2024-04-04 NOTE — Assessment & Plan Note (Signed)
 LDL has greatly improved since starting Rosuvastatin  5 mg in March. Advised to continue this medication. Updating lipid panel and CMP with labs today. Will cont to monitor.

## 2024-04-04 NOTE — Assessment & Plan Note (Signed)
 Secondary to multiple back surgeries. Managed with pregabalin  at bedtime. - Continue pregabalin  at bedtime.

## 2024-04-04 NOTE — Assessment & Plan Note (Signed)
 BP Goal <130/80. BP well within goal today in office. Continue Valsartan  160 mg daily. Checking CMP today. Will cont to monitor

## 2024-04-04 NOTE — Assessment & Plan Note (Signed)
 Managed with mirtazapine  at bedtime. Anxiety levels elevated due to family stressors but manageable. - Continue mirtazapine  at bedtime. - Monitor anxiety levels and adjust treatment if necessary.

## 2024-04-04 NOTE — Assessment & Plan Note (Signed)
 Routine health maintenance up to date except for Pap smear and mammogram. Discussed vaccine eligibility; declined vaccines today.  - Ordered fasting blood work including A1c, vitamin D , and thyroid function tests, CBC, CMP, lipid.  - Encouraged scheduling of Pap smear and mammogram.

## 2024-04-04 NOTE — Progress Notes (Signed)
 "  Complete physical exam  Patient: Brianna Ball   DOB: [birth year]   48 y.o. Female  MRN: [id number]  Subjective:    Chief Complaint  Patient presents with   Annual Exam    History of Present Illness   Brianna Ball is a 48 year old female who presents for an annual physical exam.  Medication management - Currently taking Zepbound  once a month for weight loss maintenance; missed a dose two months ago due to leftover medication. - Requests Zepbound  refill through [facility] and needs smaller syringes due to pain with previous longer syringes.  Chronic pain and neuropathy - Continues to experience nerve pain in the legs following three prior back surgeries. - Takes pregabalin  at bedtime for neuropathic pain control   Anxiety and sleep disturbance - Takes mirtazapine  at bedtime for anxiety and sleep. - Anxiety has been elevated due to recent events and life changes but remains manageable. - Prefers to avoid additional medications for anxiety or sleep at this time.  Hyperlipidemia - Continues rosuvastatin  5 mg for cholesterol management without side effects   Hypertension - Takes valsartan  for blood pressure control. - Tolerating well without side effects   Preventive health maintenance - Has not received a flu shot this season. - Eligible for pneumonia and shingles vaccines, declines today  - Overdue for Pap smear and mammogram; [facility]         Most recent fall risk assessment:    04/04/2024   11:34 AM  Fall Risk   Falls in the past year? 1  Injury with Fall? 0  Follow up Falls evaluation completed     Most recent depression screenings:    04/04/2024   11:35 AM 04/20/2023   10:13 AM  PHQ 2/9 Scores  PHQ - 2 Score 1 0  PHQ- 9 Score 4 2      Data saved with a previous flowsheet row definition    Vision:Within last year and Dental: No current dental problems and Receives regular dental care    Patient Care  Team: Gayle Saddie JULIANNA DEVONNA as PCP - General (Physician Assistant) [place], [facility], Alm Hamilton, MD as Consulting Physician (Neurosurgery)   Show/hide medication list[1]  ROS   Per HPI     Objective:     BP 124/75   Pulse 61   Temp 98.2 F (36.8 C) (Oral)   Ht 5' 1 (1.549 m)   Wt 118 lb 0.6 oz (53.5 kg)   SpO2 100%   BMI 22.30 kg/m    Physical Exam Constitutional:      General: Brianna Ball is not in acute distress.    Appearance: Normal appearance.  Eyes:     Pupils: Pupils are equal, round, and reactive to light.  Cardiovascular:     Rate and Rhythm: Normal rate and regular rhythm.     Heart sounds: Normal heart sounds. No murmur heard.    No friction rub. No gallop.  Pulmonary:     Effort: Pulmonary effort is normal. No respiratory distress.     Breath sounds: Normal breath sounds.  Abdominal:     General: Bowel sounds are normal.     Palpations: Abdomen is soft.  Musculoskeletal:        General: No swelling.     Cervical back: Neck supple.  Lymphadenopathy:     Cervical: No cervical adenopathy.  Skin:    General: Skin is warm and dry.  Neurological:     General: No focal deficit present.     Mental Status: Brianna Ball is alert.  Psychiatric:        Mood and Affect: Mood normal.        Behavior: Behavior normal.        Thought Content: Thought content normal.      No results found for any visits on 04/04/24. Last CBC Lab Results  Component Value Date   WBC 6.0 06/09/2023   HGB 13.5 06/09/2023   HCT 40.7 06/09/2023   MCV 90 06/09/2023   MCH 30.0 06/09/2023   RDW 12.3 06/09/2023   PLT 293 06/09/2023   Last metabolic panel Lab Results  Component Value Date   GLUCOSE 81 06/09/2023   NA 143 06/09/2023   K 4.4 06/09/2023   CL 106 06/09/2023   CO2 22 06/09/2023   BUN 11 06/09/2023   CREATININE 0.93 06/09/2023   EGFR 75 06/09/2023   CALCIUM  9.7 06/09/2023   PROT 6.9 06/09/2023   ALBUMIN 4.7 06/09/2023   LABGLOB 2.2 06/09/2023    AGRATIO 1.9 09/01/2022   BILITOT 0.5 06/09/2023   ALKPHOS 71 06/09/2023   AST 15 06/09/2023   ALT 13 06/09/2023   ANIONGAP 9 02/01/2020   Last lipids Lab Results  Component Value Date   CHOL 171 06/09/2023   HDL 73 06/09/2023   LDLCALC 85 06/09/2023   LDLDIRECT 167 (H) 01/03/2020   TRIG 65 06/09/2023   CHOLHDL 2.3 06/09/2023   Last hemoglobin A1c Lab Results  Component Value Date   HGBA1C 5.1 06/09/2023   Last thyroid functions Lab Results  Component Value Date   TSH 3.250 06/23/2022   T3TOTAL 117 12/23/2018   FREET4 1.08 12/23/2018   Last vitamin D  Lab Results  Component Value Date   VD25OH 27.3 (L) 06/23/2022        Assessment & Plan:    Routine Health Maintenance and Physical Exam  Health Maintenance  Topic Date Due   Hepatitis B Vaccine (1 of 3 - 19+ 3-dose series) Never done   Breast Cancer Screening  08/30/2018   COVID-19 Vaccine (2 - 2025-26 season) 04/20/2024*   Zoster (Shingles) Vaccine (1 of 2) 07/03/2024*   Flu Shot  07/12/2024*   Pneumococcal Vaccine for age over 5 (1 of 1 - PCV) 04/04/2025*   DTaP/Tdap/Td vaccine (2 - Td or Tdap) 11/01/2025   Colon Cancer Screening  08/18/2032   Hepatitis C Screening  Completed   HIV Screening  Completed   HPV Vaccine  Aged Out   Meningitis B Vaccine  Aged Out  *Topic was postponed. The date shown is not the original due date.    Discussed health benefits of physical activity, and encouraged Brianna Ball to engage in regular exercise appropriate for Brianna Ball age and condition.  Mixed hyperlipidemia Assessment & Plan: LDL has greatly improved since starting Rosuvastatin  5 mg in March. Advised to continue this medication. Updating lipid panel and CMP with labs today. Will cont to monitor.  Orders: -     Hemoglobin A1c; Future -     Lipid panel; Future -     Comprehensive metabolic panel with GFR; Future  Class 1 obesity due to excess calories with serious comorbidity and body mass index (BMI) of 30.0 to 30.9 in  adult Assessment & Plan: SW: 159 lbs  Current weight: 118 lbs   Patient is using Zepbound  2.5 mg once a month to help with weight maintenance as Brianna Ball has reached Brianna Ball weight loss goal  and does not desire to lose more weight. Refill sent to [facility].   Orders: -     Zepbound ; Inject 2.5 mg into the skin once a week.  Dispense: 2 mL; Refill: 3 -     Insulin  Syringe; Use syringe once weekly to inject Zepbound   Dispense: 100 each; Refill: 0  Primary hypertension Assessment & Plan: BP Goal <130/80. BP well within goal today in office. Continue Valsartan  160 mg daily. Checking CMP today. Will cont to monitor  Orders: -     TSH; Future -     Comprehensive metabolic panel with GFR; Future -     CBC with Differential/Platelet; Future  Screening for endocrine, metabolic and immunity disorder -     VITAMIN D  25 Hydroxy (Vit-D Deficiency, Fractures); Future -     TSH; Future -     Hemoglobin A1c; Future -     Lipid panel; Future -     Comprehensive metabolic panel with GFR; Future -     CBC with Differential/Platelet; Future  Vitamin D  insufficiency Assessment & Plan: Rechecking vit D with labs. Will supplement as indicated   Psychophysiological insomnia Assessment & Plan: Managed with mirtazapine  at bedtime. Anxiety levels elevated due to family stressors but manageable. - Continue mirtazapine  at bedtime. - Monitor anxiety levels and adjust treatment if necessary.   Anxiety, generalized Assessment & Plan: Managed with mirtazapine  at bedtime. Anxiety levels elevated due to family stressors but manageable. - Continue mirtazapine  at bedtime. - Monitor anxiety levels and adjust treatment if necessary.   Radicular pain Assessment & Plan: Secondary to multiple back surgeries. Managed with pregabalin  at bedtime. - Continue pregabalin  at bedtime.   Healthcare maintenance Assessment & Plan: Routine health maintenance up to date except for Pap smear and mammogram. Discussed vaccine  eligibility; declined vaccines today.  - Ordered fasting blood work including A1c, vitamin D , and thyroid function tests, CBC, CMP, lipid.  - Encouraged scheduling of Pap smear and mammogram.     Return in about 6 months (around 10/03/2024) for HTN, HLD, Mood.     Saddie JULIANNA Sacks, PA-C     [1]  Outpatient Medications Prior to Visit  Medication Sig   ibuprofen (ADVIL) 600 MG tablet Take 600 mg by mouth every 6 (six) hours as needed.   mirtazapine  (REMERON ) 15 MG tablet TAKE 1/2 TO 1 TABLET BY MOUTH NIGHTLY AT BEDTIME.   pregabalin  (LYRICA ) 25 MG capsule TAKE 1 CAPSULE (25 MG TOTAL) BY MOUTH DAILY.   rosuvastatin  (CRESTOR ) 5 MG tablet TAKE 1 TABLET (5 MG TOTAL) BY MOUTH DAILY.   Syringe, Disposable, 1 ML MISC Use one syringe for Zepbound  injection once weekly.   valsartan  (DIOVAN ) 160 MG tablet TAKE 1 TABLET (160 MG TOTAL) BY MOUTH DAILY.   [DISCONTINUED] ZEPBOUND  2.5 MG/0.5ML injection vial INJECT 0.5 ML (2.5 MG) UNDER THE SKIN ONCE WEEKLY (0.5ML= 50 UNITS)   No facility-administered medications prior to visit.   "

## 2024-04-05 ENCOUNTER — Other Ambulatory Visit

## 2024-04-05 DIAGNOSIS — I10 Essential (primary) hypertension: Secondary | ICD-10-CM

## 2024-04-05 DIAGNOSIS — E782 Mixed hyperlipidemia: Secondary | ICD-10-CM

## 2024-04-05 DIAGNOSIS — E6609 Other obesity due to excess calories: Secondary | ICD-10-CM

## 2024-04-05 DIAGNOSIS — Z130 Encounter for screening for diseases of the blood and blood-forming organs and certain disorders involving the immune mechanism: Secondary | ICD-10-CM

## 2024-04-05 MED ORDER — ZEPBOUND 2.5 MG/0.5ML ~~LOC~~ SOLN: 2.5000 mg | SUBCUTANEOUS | 3 refills | Status: AC

## 2024-04-06 LAB — CBC WITH DIFFERENTIAL/PLATELET
Basophils Absolute: 0.1 x10E3/uL (ref 0.0–0.2)
Basos: 1 %
EOS (ABSOLUTE): 0.1 x10E3/uL (ref 0.0–0.4)
Eos: 2 %
Hematocrit: 40.3 % (ref 34.0–46.6)
Hemoglobin: 12.7 g/dL (ref 11.1–15.9)
Immature Grans (Abs): 0 x10E3/uL (ref 0.0–0.1)
Immature Granulocytes: 0 %
Lymphocytes Absolute: 2.3 x10E3/uL (ref 0.7–3.1)
Lymphs: 42 %
MCH: 29.3 pg (ref 26.6–33.0)
MCHC: 31.5 g/dL (ref 31.5–35.7)
MCV: 93 fL (ref 79–97)
Monocytes Absolute: 0.4 x10E3/uL (ref 0.1–0.9)
Monocytes: 8 %
Neutrophils Absolute: 2.6 x10E3/uL (ref 1.4–7.0)
Neutrophils: 47 %
Platelets: 279 x10E3/uL (ref 150–450)
RBC: 4.33 x10E6/uL (ref 3.77–5.28)
RDW: 12.1 % (ref 11.7–15.4)
WBC: 5.5 x10E3/uL (ref 3.4–10.8)

## 2024-04-06 LAB — COMPREHENSIVE METABOLIC PANEL WITH GFR
ALT: 15 IU/L (ref 0–32)
AST: 20 IU/L (ref 0–40)
Albumin: 4.4 g/dL (ref 3.9–4.9)
Alkaline Phosphatase: 66 IU/L (ref 41–116)
BUN/Creatinine Ratio: 13 (ref 9–23)
BUN: 11 mg/dL (ref 6–24)
Bilirubin Total: 0.5 mg/dL (ref 0.0–1.2)
CO2: 25 mmol/L (ref 20–29)
Calcium: 9.3 mg/dL (ref 8.7–10.2)
Chloride: 104 mmol/L (ref 96–106)
Creatinine, Ser: 0.86 mg/dL (ref 0.57–1.00)
Globulin, Total: 1.9 g/dL (ref 1.5–4.5)
Glucose: 75 mg/dL (ref 70–99)
Potassium: 4.2 mmol/L (ref 3.5–5.2)
Sodium: 141 mmol/L (ref 134–144)
Total Protein: 6.3 g/dL (ref 6.0–8.5)
eGFR: 82 mL/min/1.73

## 2024-04-06 LAB — LIPID PANEL
Chol/HDL Ratio: 2.4 ratio (ref 0.0–4.4)
Cholesterol, Total: 180 mg/dL (ref 100–199)
HDL: 74 mg/dL
LDL Chol Calc (NIH): 91 mg/dL (ref 0–99)
Triglycerides: 79 mg/dL (ref 0–149)
VLDL Cholesterol Cal: 15 mg/dL (ref 5–40)

## 2024-04-06 LAB — VITAMIN D 25 HYDROXY (VIT D DEFICIENCY, FRACTURES): Vit D, 25-Hydroxy: 42.4 ng/mL (ref 30.0–100.0)

## 2024-04-06 LAB — HEMOGLOBIN A1C
Est. average glucose Bld gHb Est-mCnc: 97 mg/dL
Hgb A1c MFr Bld: 5 % (ref 4.8–5.6)

## 2024-04-06 LAB — TSH: TSH: 3.22 u[IU]/mL (ref 0.450–4.500)

## 2024-04-08 ENCOUNTER — Ambulatory Visit: Payer: Self-pay

## 2024-04-12 MED ORDER — "INSULIN SYRINGE 31G X 5/16"" 1 ML MISC": 0 refills | Status: AC

## 2024-04-12 NOTE — Addendum Note (Signed)
 Addended byBETHA GAYLE NUMBERS on: 04/12/2024 01:09 PM   Modules accepted: Orders

## 2024-10-03 ENCOUNTER — Ambulatory Visit
